# Patient Record
Sex: Female | Born: 1960 | Race: White | Hispanic: No | Marital: Married | Smoking: Former smoker
Health system: Southern US, Community
[De-identification: ages and names within clinical notes are randomized; demographics above are authoritative.]

## PROBLEM LIST (undated history)

## (undated) DIAGNOSIS — E559 Vitamin D deficiency, unspecified: Secondary | ICD-10-CM

## (undated) DIAGNOSIS — M1711 Unilateral primary osteoarthritis, right knee: Secondary | ICD-10-CM

## (undated) DIAGNOSIS — E063 Autoimmune thyroiditis: Secondary | ICD-10-CM

## (undated) DIAGNOSIS — L929 Granulomatous disorder of the skin and subcutaneous tissue, unspecified: Secondary | ICD-10-CM

## (undated) DIAGNOSIS — E039 Hypothyroidism, unspecified: Secondary | ICD-10-CM

## (undated) DIAGNOSIS — F32A Depression, unspecified: Secondary | ICD-10-CM

## (undated) DIAGNOSIS — E7800 Pure hypercholesterolemia, unspecified: Secondary | ICD-10-CM

## (undated) DIAGNOSIS — F329 Major depressive disorder, single episode, unspecified: Secondary | ICD-10-CM

## (undated) DIAGNOSIS — M1990 Unspecified osteoarthritis, unspecified site: Secondary | ICD-10-CM

## (undated) DIAGNOSIS — K219 Gastro-esophageal reflux disease without esophagitis: Secondary | ICD-10-CM

## (undated) DIAGNOSIS — J45909 Unspecified asthma, uncomplicated: Secondary | ICD-10-CM

## (undated) DIAGNOSIS — T7840XA Allergy, unspecified, initial encounter: Secondary | ICD-10-CM

## (undated) HISTORY — DX: Gastro-esophageal reflux disease without esophagitis: K21.9

## (undated) HISTORY — DX: Major depressive disorder, single episode, unspecified: F32.9

## (undated) HISTORY — DX: Vitamin D deficiency, unspecified: E55.9

## (undated) HISTORY — DX: Depression, unspecified: F32.A

## (undated) HISTORY — DX: Unspecified osteoarthritis, unspecified site: M19.90

## (undated) HISTORY — DX: Unspecified asthma, uncomplicated: J45.909

## (undated) HISTORY — DX: Hypothyroidism, unspecified: E03.9

## (undated) HISTORY — PX: COLONOSCOPY: SHX174

## (undated) HISTORY — PX: CARPOMETACARPAL (CMC) FUSION OF THUMB: SHX6290

## (undated) HISTORY — DX: Allergy, unspecified, initial encounter: T78.40XA

## (undated) HISTORY — PX: OTHER SURGICAL HISTORY: SHX169

## (undated) HISTORY — PX: WISDOM TOOTH EXTRACTION: SHX21

---

## 2000-10-16 ENCOUNTER — Other Ambulatory Visit: Admission: RE | Admit: 2000-10-16 | Discharge: 2000-10-16 | Payer: Self-pay | Admitting: *Deleted

## 2001-02-13 ENCOUNTER — Encounter: Admission: RE | Admit: 2001-02-13 | Discharge: 2001-02-13 | Payer: Self-pay | Admitting: *Deleted

## 2001-02-13 ENCOUNTER — Encounter: Payer: Self-pay | Admitting: *Deleted

## 2004-01-13 ENCOUNTER — Other Ambulatory Visit: Admission: RE | Admit: 2004-01-13 | Discharge: 2004-01-13 | Payer: Self-pay | Admitting: Gynecology

## 2006-12-27 ENCOUNTER — Other Ambulatory Visit: Admission: RE | Admit: 2006-12-27 | Discharge: 2006-12-27 | Payer: Self-pay | Admitting: Gynecology

## 2008-12-03 ENCOUNTER — Other Ambulatory Visit: Admission: RE | Admit: 2008-12-03 | Discharge: 2008-12-03 | Payer: Self-pay | Admitting: Gynecology

## 2008-12-03 ENCOUNTER — Encounter: Payer: Self-pay | Admitting: Women's Health

## 2008-12-03 ENCOUNTER — Ambulatory Visit: Payer: Self-pay | Admitting: Women's Health

## 2008-12-15 ENCOUNTER — Ambulatory Visit: Payer: Self-pay | Admitting: Gynecology

## 2009-12-16 ENCOUNTER — Other Ambulatory Visit: Admission: RE | Admit: 2009-12-16 | Discharge: 2009-12-16 | Payer: Self-pay | Admitting: Gynecology

## 2009-12-16 ENCOUNTER — Ambulatory Visit: Payer: Self-pay | Admitting: Women's Health

## 2010-06-16 ENCOUNTER — Ambulatory Visit (INDEPENDENT_AMBULATORY_CARE_PROVIDER_SITE_OTHER): Payer: 59 | Admitting: Women's Health

## 2010-06-16 DIAGNOSIS — N898 Other specified noninflammatory disorders of vagina: Secondary | ICD-10-CM

## 2010-06-16 DIAGNOSIS — B373 Candidiasis of vulva and vagina: Secondary | ICD-10-CM

## 2011-02-02 ENCOUNTER — Other Ambulatory Visit: Payer: Self-pay | Admitting: Women's Health

## 2011-02-20 ENCOUNTER — Telehealth: Payer: Self-pay | Admitting: Obstetrics and Gynecology

## 2011-02-20 MED ORDER — NITROFURANTOIN MONOHYD MACRO 100 MG PO CAPS: 100.0000 mg | ORAL_CAPSULE | Freq: Two times a day (BID) | ORAL | Status: AC

## 2011-02-20 NOTE — Telephone Encounter (Signed)
Pt called complaining of dysuria, frequency and urgency. She was treated with Macrobid bid with food for 7 days. She will come in for toc next week.

## 2011-05-22 ENCOUNTER — Encounter: Payer: Self-pay | Admitting: Women's Health

## 2011-10-12 ENCOUNTER — Encounter: Payer: Self-pay | Admitting: *Deleted

## 2011-10-12 ENCOUNTER — Ambulatory Visit (INDEPENDENT_AMBULATORY_CARE_PROVIDER_SITE_OTHER): Payer: 59 | Admitting: Women's Health

## 2011-10-12 ENCOUNTER — Encounter: Payer: Self-pay | Admitting: Women's Health

## 2011-10-12 DIAGNOSIS — L259 Unspecified contact dermatitis, unspecified cause: Secondary | ICD-10-CM

## 2011-10-12 DIAGNOSIS — R350 Frequency of micturition: Secondary | ICD-10-CM

## 2011-10-12 DIAGNOSIS — L309 Dermatitis, unspecified: Secondary | ICD-10-CM

## 2011-10-12 LAB — URINALYSIS W MICROSCOPIC + REFLEX CULTURE
Bilirubin Urine: NEGATIVE
Casts: NONE SEEN
Crystals: NONE SEEN
Glucose, UA: NEGATIVE mg/dL
Ketones, ur: NEGATIVE mg/dL
Nitrite: NEGATIVE
Protein, ur: NEGATIVE mg/dL
Specific Gravity, Urine: <1.005 — ABNORMAL LOW (ref 1.005–1.030)
Urobilinogen, UA: 0.2 mg/dL (ref 0.0–1.0)
pH: 5.5 (ref 5.0–8.0)

## 2011-10-12 MED ORDER — DOXYCYCLINE HYCLATE 100 MG PO TABS: ORAL_TABLET | ORAL | Status: DC

## 2011-10-12 MED ORDER — NITROFURANTOIN MONOHYD MACRO 100 MG PO CAPS: 100.0000 mg | ORAL_CAPSULE | Freq: Two times a day (BID) | ORAL | Status: AC

## 2011-10-12 MED ORDER — NITROFURANTOIN MACROCRYSTAL 50 MG PO CAPS: ORAL_CAPSULE | ORAL | Status: DC

## 2011-10-12 NOTE — Patient Instructions (Addendum)

## 2011-10-12 NOTE — Progress Notes (Signed)
Patient ID: Nancy Burch, female   DOB: 07/14/1960, 51 y.o.   MRN: [id number] Presents with the complaint of increased urinary frequency, urgency, pain at end of the stream of urination, symptoms started yesterday. Denies any vaginal discharge or fever. Has had some problems with increased UTIs in the past several years that she relates more to intercourse, which is infrequent, same partner.  Exam: UA large blood, small leukocytes, WBCs TNTC. No CVAT.  UTI  Plan: Macrobid 1 by mouth twice a day for 7 days, take with food, prescription and instructions given. Macrodantin 50 mg with intercourse to help prevent UTIs. Also requested refill on her doxycycline that she uses for eczema will followup with dermatologist. Prescription was given, reviewed will need to followup with dermatologist for additional refills. Instructed to call or return if no relief of symptoms. UTI prevention discussed.

## 2011-10-15 LAB — URINE CULTURE

## 2011-10-16 ENCOUNTER — Telehealth: Payer: Self-pay | Admitting: Women's Health

## 2011-10-16 NOTE — Telephone Encounter (Signed)
I closed result note in error before I was finished.  Patient called back and said her UTI sx have completely resolved. She is finishing up the Macrobid.   Notes Recorded by Keenan Bachelor on 10/16/2011 at 10:23 AM Left message to call me. ------  Notes Recorded by Harrington Challenger, NP on 10/16/2011 at 7:59 AM Please call patient and inform urine culture was positive for small amount of bacteria. Ask if better, if not let me know.

## 2011-10-19 ENCOUNTER — Encounter: Payer: Self-pay | Admitting: Women's Health

## 2012-04-17 ENCOUNTER — Encounter: Payer: Self-pay | Admitting: Women's Health

## 2012-04-17 ENCOUNTER — Ambulatory Visit (INDEPENDENT_AMBULATORY_CARE_PROVIDER_SITE_OTHER): Payer: 59 | Admitting: Women's Health

## 2012-04-17 DIAGNOSIS — F32A Depression, unspecified: Secondary | ICD-10-CM

## 2012-04-17 DIAGNOSIS — F329 Major depressive disorder, single episode, unspecified: Secondary | ICD-10-CM

## 2012-04-17 DIAGNOSIS — J329 Chronic sinusitis, unspecified: Secondary | ICD-10-CM

## 2012-04-17 MED ORDER — PAROXETINE HCL 20 MG PO TABS: 20.0000 mg | ORAL_TABLET | ORAL | Status: DC

## 2012-04-17 MED ORDER — AZITHROMYCIN 250 MG PO TABS: ORAL_TABLET | ORAL | Status: DC

## 2012-04-17 NOTE — Patient Instructions (Signed)

## 2012-04-17 NOTE — Progress Notes (Signed)
Patient ID: [name], female   DOB: [birth year], 52 y.o.   MRN: [id number] Presents with grief from sudden loss of 67 year old daughter who died 2 months ago from pulmonary embolism/seizure/questionable CVA. States cries most days and is having difficulty moving on. Helping to care for her 66-year-old son. .Also states has been battling a upper respiratory/sinus infection and is not getting better on Levaquin after 7 days. Reports cough productive of green mucus, nasal congestion with minimal relief from Robitussin. Denies a fever. States has had problems with depression in the past and has used Paxil with good relief of symptoms.  Exam: Tearful, pain/pressure sinus facial cavities, throat red, lungs clear throughout.  Sinus infection resistant to Levaquin Unresolved grief/depression  Plan: Prescription for a Z-Pak to take 2 tablets tomorrow, 1 daily for 5 days prescription given. Instructed to increase rest as able.instructed to call or return if no relief of symptoms in one week. Reviewed importance of a counselor to help deal with grief. [name] business card given and instructed to schedule appointment, also reviewed grief support groups in the community may be helpful. Condolences given.

## 2012-05-30 ENCOUNTER — Encounter: Payer: Self-pay | Admitting: Women's Health

## 2012-05-30 ENCOUNTER — Ambulatory Visit (INDEPENDENT_AMBULATORY_CARE_PROVIDER_SITE_OTHER): Payer: 59 | Admitting: Women's Health

## 2012-05-30 ENCOUNTER — Telehealth: Payer: Self-pay | Admitting: *Deleted

## 2012-05-30 VITALS — Ht 64.0 in | Wt 212.0 lb

## 2012-05-30 DIAGNOSIS — R490 Dysphonia: Secondary | ICD-10-CM

## 2012-05-30 DIAGNOSIS — E079 Disorder of thyroid, unspecified: Secondary | ICD-10-CM

## 2012-05-30 LAB — T4: T4, Total: 8.2 ug/dL (ref 5.0–12.5)

## 2012-05-30 LAB — T3 UPTAKE: T3 Uptake: 31.2 % (ref 22.5–37.0)

## 2012-05-30 LAB — TSH: TSH: 3.093 u[IU]/mL (ref 0.350–4.500)

## 2012-05-30 MED ORDER — ALBUTEROL SULFATE HFA 108 (90 BASE) MCG/ACT IN AERS: 2.0000 | INHALATION_SPRAY | Freq: Four times a day (QID) | RESPIRATORY_TRACT | Status: DC | PRN

## 2012-05-30 NOTE — Telephone Encounter (Signed)
Per nancy staff message:  Please schedule ENT appointment, anytime ok except 06/11/12. Unexplained hoarseness without illness, checking thyroid fnct. Rt side of throat fullness. No pain  appt scheduled on 06/03/12 with Dr.Newman at 12:30 pm. Notes faxed.

## 2012-05-30 NOTE — Progress Notes (Signed)
Patient ID: Nancy Burch, female   DOB: Jul 26, 1960, 52 y.o.   MRN: [id number] Presents with complaint of hoarseness without illness for greater than 3 weeks. Feels like right side of throat is swollen at times and difficult to get a deep breath. Denies a sore throat, nasal drainage, cough, or fever. Treated for a sinus infection 2 months ago with a Z-Pak and Levaquin.  Exam: Appears well, throat pink, no erythema or exudate noted, questionable thyroid enlargement on right side, minimal tenderness with exam,  Unexplained hoarseness Questionable thyroid enlargement  Plan: Will schedule appointment for ENT to evaluate. Aware inhaler to use as a rescue inhaler until office appointment if needed. Thyroid function panel. Overdue for annual exam, will schedule.

## 2012-05-31 LAB — THYROID ANTIBODIES
Thyroglobulin Ab: 198 U/mL — ABNORMAL HIGH (ref <40.0)
Thyroperoxidase Ab SerPl-aCnc: 478 IU/mL — ABNORMAL HIGH (ref <35.0)

## 2012-06-13 ENCOUNTER — Other Ambulatory Visit (HOSPITAL_COMMUNITY)
Admission: RE | Admit: 2012-06-13 | Discharge: 2012-06-13 | Disposition: A | Discharge status: Home / Self Care | Payer: 59 | Source: Ambulatory Visit | Attending: Obstetrics and Gynecology | Admitting: Obstetrics and Gynecology

## 2012-06-13 ENCOUNTER — Encounter: Payer: Self-pay | Admitting: Women's Health

## 2012-06-13 ENCOUNTER — Ambulatory Visit (INDEPENDENT_AMBULATORY_CARE_PROVIDER_SITE_OTHER): Payer: 59 | Admitting: Women's Health

## 2012-06-13 VITALS — BP 124/82 | Ht 63.0 in | Wt 210.0 lb

## 2012-06-13 DIAGNOSIS — N912 Amenorrhea, unspecified: Secondary | ICD-10-CM

## 2012-06-13 DIAGNOSIS — Z833 Family history of diabetes mellitus: Secondary | ICD-10-CM

## 2012-06-13 DIAGNOSIS — Z01419 Encounter for gynecological examination (general) (routine) without abnormal findings: Secondary | ICD-10-CM

## 2012-06-13 DIAGNOSIS — Z13220 Encounter for screening for lipoid disorders: Secondary | ICD-10-CM

## 2012-06-13 LAB — GLUCOSE, RANDOM: Glucose, Bld: 87 mg/dL (ref 70–99)

## 2012-06-13 LAB — CBC WITH DIFFERENTIAL/PLATELET
Eosinophils Absolute: 0.2 10*3/uL (ref 0.0–0.7)
Eosinophils Relative: 3 % (ref 0–5)
HCT: 39.7 % (ref 36.0–46.0)
Hemoglobin: 13.7 g/dL (ref 12.0–15.0)
Lymphocytes Relative: 46 % (ref 12–46)
Lymphs Abs: 3 10*3/uL (ref 0.7–4.0)
MCH: 29.2 pg (ref 26.0–34.0)
MCV: 84.6 fL (ref 78.0–100.0)
Monocytes Relative: 7 % (ref 3–12)
RBC: 4.69 MIL/uL (ref 3.87–5.11)
WBC: 6.6 10*3/uL (ref 4.0–10.5)

## 2012-06-13 LAB — FOLLICLE STIMULATING HORMONE: FSH: 97.7 m[IU]/mL

## 2012-06-13 LAB — LIPID PANEL
Cholesterol: 252 mg/dL — ABNORMAL HIGH (ref 0–200)
Total CHOL/HDL Ratio: 5 Ratio
VLDL: 54 mg/dL — ABNORMAL HIGH (ref 0–40)

## 2012-06-13 NOTE — Progress Notes (Signed)
Nancy Burch 1960/10/10 [id number]    History:    The patient presents for annual exam.  Amenorrheic since June 2013 with no other menopausal symptoms. Mother with breast cancer at 72,  Normal Pap and mammogram history. Has not had a colonoscopy. 53 year old daughter died suddenly several months ago, helping to care for 60-1/2-year-old grandson CJ and son-in-law lives with her and husband. Has been on Paxil 20 mg for one month which has helped, has spoken with a counselor and will continue as needed.  Past medical history, past surgical history, family history and social history were all reviewed and documented in the EPIC chart. New onset problem of hoarseness, slight tenderness, and has had some problems with loosing her voice was seen by Dr. Ezzard Standing who did not find a problem. Has followup scheduled with Dr. Talmage Nap 07/08/12, positive thyroid antibodies were noted on thyroid panel. TSH, T3, T4 all normal.Father melanoma, sister with diabetes and colon cancer.   ROS:  A  ROS was performed and pertinent positives and negatives are included in the history.  Exam:  Filed Vitals:   06/13/12 1521  BP: 124/82    General appearance:  Normal Head/Neck:  Normal, without cervical or supraclavicular adenopathy. Thyroid:  Symmetrical, normal in size, without palpable masses or nodularity. Respiratory  Effort:  Normal  Auscultation:  Clear without wheezing or rhonchi Cardiovascular  Auscultation:  Regular rate, without rubs, murmurs or gallops  Edema/varicosities:  Not grossly evident Abdominal  Soft,nontender, without masses, guarding or rebound.  Liver/spleen:  No organomegaly noted  Hernia:  None appreciated  Skin  Inspection:  Grossly normal  Palpation:  Grossly normal Neurologic/psychiatric  Orientation:  Normal with appropriate conversation.  Mood/affect:  Normal  Genitourinary    Breasts: Examined lying and sitting.     Right: Without masses, retractions, discharge or axillary  adenopathy.     Left: Without masses, retractions, discharge or axillary adenopathy.   Inguinal/mons:  Normal without inguinal adenopathy  External genitalia:  Normal  BUS/Urethra/Skene's glands:  Normal  Bladder:  Normal  Vagina:  Normal  Cervix:  Normal  Uterus:   normal in size, shape and contour.  Midline and mobile  Adnexa/parametria:     Rt: Without masses or tenderness.   Lt: Without masses or tenderness.  Anus and perineum: Normal  Digital rectal exam: Normal sphincter tone without palpated masses or tenderness  Assessment/Plan:  52 y.o.M. WF  for annual exam.   Persistent hoarseness causing her to lose her voice for the past 6 weeks, normal exam with Dr. Narda Bonds ENT 06/03/12. Positive thyroid antibodies with normal TSH, T3, T4, followup Dr. Talmage Nap Situational depression/sudden death of 27 year old daughter/Paxil started 2/ 2014 with good results Obesity   Plan: Keep scheduled followup with Dr. Talmage Nap 07/08/12. SBE's, annual mammogram, overdue instructed to schedule. Colonoscopy, instructed to schedule. Paxil 20 mg prescription, proper use, reviewed importance of continuing medication at least 6 months, return to counseling as needed. Increase regular exercise and decrease calories for weight loss, vitamin D 2000 daily encouraged. Annual skin check with dermatologist/father history of melanoma.CBC, FSH, glucose, lipid panel, UA, Pap. Pap normal 2011, and new guidelines reviewed.    Harrington Challenger [facility], 4:58 PM 06/13/2012

## 2012-06-13 NOTE — Patient Instructions (Addendum)
Dr Luevenia Maxin GI Health Recommendations for Postmenopausal Women Based on the Results of the Women's Health Initiative [facility]) and Other Studies The WHI is a major 15-year research program to address the most common causes of death, disability and poor quality of life in postmenopausal women. Some of these causes are heart disease, cancer, bone loss (osteoporosis) and others. Taking into account all of the findings from [facility] and other studies, here are bottom-line health recommendations for women: CARDIOVASCULAR DISEASE Heart Disease: A heart attack is a medical emergency. Know the signs and symptoms of a heart attack. Hormone therapy should not be used to prevent heart disease. In women with heart disease, hormone therapy should not be used to prevent further disease. Hormone therapy increases the risk of blood clots. Below are things women can do to reduce their risk for heart disease.   Do not smoke. If you smoke, quit. Women who smoke are 2 to 6 times more likely to suffer a heart attack than non-smoking women.  Aim for a healthy weight. Being overweight causes many preventable deaths. Eat a healthy and balanced diet and drink an adequate amount of liquids.  Get moving. Make a commitment to be more physically active. Aim for 30 minutes of activity on most, if not all days of the week.  Eat for heart health. Choose a diet that is low in saturated fat, trans fat, and cholesterol. Include whole grains, vegetables, and fruits. Read the labels on the food container before buying it.  Know your numbers. Ask your caregiver to check your blood pressure, cholesterol (total, HDL, LDL, triglycerides) and blood glucose. Work with your caregiver to improve any numbers that are not normal.  High blood pressure. Limit or stop your table salt intake (try salt substitute and food seasonings), avoid salty foods and drinks. Read the labels on the food container before buying it. Avoid becoming overweight by eating  well and exercising. STROKE  Stroke is a medical emergency. Stroke can be the result of a blood clot in the blood vessel in the brain or by a brain hemorrhage (bleeding). Know the signs and symptoms of a stroke. To lower the risk of developing a stroke:  Avoid fatty foods.  Quit smoking.  Control your diabetes, blood pressure, and irregular heart rate. THROMBOPHLIBITIS (BLOOD CLOT) OF THE LEG  Hormone treatment is a big cause of developing blood clots in the leg. Becoming overweight and leading a stationary lifestyle also may contribute to developing blood clots. Controlling your diet and exercising will help lower the risk of developing blood clots. CANCER SCREENING  Breast Cancer: Women should take steps to reduce their risk of breast cancer. This includes having regular mammograms, monthly self breast exams and regular breast exams by your caregiver. Have a mammogram every one to two years if you are 54 to 53 years old. Have a mammogram annually if you are 55 years old or older depending on your risk factors. Women who are high risk for breast cancer may need more frequent mammograms. There are tests available (testing the genes in your body) if you have family history of breast cancer called BRCA 1 and 2. These tests can help determine the risks of developing breast cancer.  Intestinal or Stomach Cancer: Women should talk to their caregiver about when to start screening, what tests and how often they should be done, and the benefits and risks of doing these tests. Tests to consider are a rectal exam, fecal occult blood, sigmoidoscopy, colononoscoby, barium enema and  upper GI series of the stomach. Depending on the age, you may want to get a medical and family history of colon cancer. Women who are high risk may need to be screened at an earlier age and more often.  Cervical Cancer: A Pap test of the cervix should be done every year and every 3 years when there has been three straight years of a  normal Pap test. Women with an abnormal Pap test should be screened more often or have a cervical biopsy depending on your caregiver's recommendation.  Uterine Cancer: If you have vaginal bleeding after you are in the menopause, it should be evaluated by your caregiver.  Ovarian cancer: There are no reliable tests available to screen for ovarian cancer at this time except for yearly pelvic exams.  Lung Cancer: Yearly chest X-rays can detect lung cancer and should be done on high risk women, such as cigarette smokers and women with chronic lung disease (emphysemia).  Skin Cancer: A complete body skin exam should be done at your yearly examination. Avoid overexposure to the sun and ultraviolet light lamps. Use a strong sun block cream when in the sun. All of these things are important in lowering the risk of skin cancer. MENOPAUSE Menopause Symptoms: Hormone therapy products are effective for treating symptoms associated with menopause:  Moderate to severe hot flashes.  Night sweats.  Mood swings.  Headaches.  Tiredness.  Loss of sex drive.  Insomnia.  Other symptoms. However, hormone therapy products carry serious risks, especially in older women. Women who use or are thinking about using estrogen or estrogen with progestin treatments should discuss that with their caregiver. Your caregiver will know if the benefits outweigh the risks. The Food and Drug Administration (FDA) has concluded that hormone therapy should be used only at the lowest doses and for the shortest amount of time to reach treatment goals. It is not known at what doses there may be less risk of serious side effects. There are other treatments such as herbal medication (not controlled or regulated by the FDA), group therapy, counseling and acupuncture that may be helpful. OSTEOPOROSIS Protecting Against Bone Loss and Preventing Fracture: If hormone therapy is used for prevention of bone loss (osteoporosis), the risks for  bone loss must outweigh the risk of the therapy. Women considering taking hormone therapy for bone loss should ask their health care providers about other medications (fosamax and boniva) that are considered safe and effective for preventing bone loss and bone fractures. To guard against bone loss or fractures, it is recommended that women should take at least 1000-1500 mg of calcium and 400-800 IU of vitamin D daily in divided doses. Smoking and excessive alcohol intake increases the risk of osteoporosis. Eat foods rich in calcium and vitamin D and do weight bearing exercises several times a week as your caregiver suggests. DIABETES Diabetes Melitus: Women with Type I or Type 2 diabetes should keep their diabetes in control with diet, exercise and medication. Avoid too many sweets, starchy and fatty foods. Being overweight can affect your diabetes. COGNITION AND MEMORY Cognition and Memory: Menopausal hormone therapy is not recommended for the prevention of cognitive disorders such as Alzheimer's disease or memory loss. WHI found that women treated with hormone therapy have a greater risk of developing dementia.  DEPRESSION  Depression may occur at any age, but is common in elderly women. The reasons may be because of physical, medical, social (loneliness), financial and/or economic problems and needs. Becoming involved with church, volunteer  or social groups, seeking treatment for any physical or medical problems is recommended. Also, look into getting professional advice for any economic or financial problems. ACCIDENTS  Accidents are common and can be serious in the elderly woman. Prepare your house to prevent accidents. Eliminate throw rugs, use hip protectors, place hand bars in the bath, shower and toilet areas. Avoid wearing high heel shoes and walking on wet, snowy and icy areas. Stop driving if you have vision, hearing problems or are unsteady with you movements and reflexes. RHEUMATOID  ARTHRITIS Rheumatoid arthritis causes pain, swelling and stiffness of your bone joints. It can limit many of your activities. Over-the-counter medications may help, but prescription medications may be necessary. Talk with your caregiver about this. Exercise (walking, water aerobics), good posture, using splints on painful joints, warm baths or applying warm compresses to stiff joints and cold compresses to painful joints may be helpful. Smoking and excessive drinking may worsen the symptoms of arthritis. Seek help from a physical therapist if the arthritis is becoming a problem with your daily activities. IMMUNIZATIONS  Several immunizations are important to have during your senior years, including:   Tetanus and a diptheria shot booster every 10 years.  Influenza every year before the flu season begins.  Pneumonia vaccine.  Shingles vaccine.  Others as indicated (example: H1N1 vaccine). Document Released: 04/07/2005 Document Revised: 05/08/2011 Document Reviewed: 12/02/2007 [facility] Patient Information 2013 [place], [place].

## 2012-06-14 LAB — URINALYSIS W MICROSCOPIC + REFLEX CULTURE
Bacteria, UA: NONE SEEN
Casts: NONE SEEN
Crystals: NONE SEEN
Glucose, UA: NEGATIVE mg/dL
Hgb urine dipstick: NEGATIVE
Ketones, ur: NEGATIVE mg/dL
Leukocytes, UA: NEGATIVE
Nitrite: NEGATIVE
Specific Gravity, Urine: 1.017 (ref 1.005–1.030)
pH: 6 (ref 5.0–8.0)

## 2012-08-05 ENCOUNTER — Other Ambulatory Visit: Payer: Self-pay

## 2012-08-05 DIAGNOSIS — F329 Major depressive disorder, single episode, unspecified: Secondary | ICD-10-CM

## 2012-08-05 MED ORDER — PAROXETINE HCL 20 MG PO TABS: 20.0000 mg | ORAL_TABLET | ORAL | Status: DC

## 2012-08-05 NOTE — Telephone Encounter (Signed)
Telephone call, states is doing better, would like to continue the Paxil 20.

## 2012-08-05 NOTE — Telephone Encounter (Signed)
Okay to call in paxil, I left her message to call, her 16 something-year-old daughter died last year/seizure or a stroke. Ask her how she is doing, has she had counseling, does she need to come in to discuss meds.

## 2012-08-06 ENCOUNTER — Other Ambulatory Visit: Payer: Self-pay | Admitting: Women's Health

## 2012-08-06 MED ORDER — PAROXETINE HCL 20 MG PO TABS: 20.0000 mg | ORAL_TABLET | ORAL | Status: DC

## 2012-09-09 ENCOUNTER — Encounter: Payer: Self-pay | Admitting: Women's Health

## 2012-12-02 ENCOUNTER — Encounter: Payer: Self-pay | Admitting: Gastroenterology

## 2013-01-02 ENCOUNTER — Other Ambulatory Visit: Payer: Self-pay

## 2013-01-08 ENCOUNTER — Other Ambulatory Visit: Payer: Self-pay | Admitting: Endocrinology

## 2013-01-08 ENCOUNTER — Ambulatory Visit (AMBULATORY_SURGERY_CENTER): Payer: Self-pay | Admitting: *Deleted

## 2013-01-08 VITALS — Ht 64.0 in | Wt 230.8 lb

## 2013-01-08 DIAGNOSIS — Z1211 Encounter for screening for malignant neoplasm of colon: Secondary | ICD-10-CM

## 2013-01-08 DIAGNOSIS — E049 Nontoxic goiter, unspecified: Secondary | ICD-10-CM

## 2013-01-08 MED ORDER — NA SULFATE-K SULFATE-MG SULF 17.5-3.13-1.6 GM/177ML PO SOLN: 1.0000 | Freq: Once | ORAL | Status: DC

## 2013-01-08 NOTE — Progress Notes (Signed)
No allergies to eggs or soy. No problems with anesthesia.  

## 2013-01-10 ENCOUNTER — Encounter: Payer: Self-pay | Admitting: Gastroenterology

## 2013-01-10 ENCOUNTER — Ambulatory Visit
Admission: RE | Admit: 2013-01-10 | Discharge: 2013-01-10 | Disposition: A | Discharge status: Home / Self Care | Payer: 59 | Source: Ambulatory Visit | Attending: Endocrinology | Admitting: Endocrinology

## 2013-01-10 ENCOUNTER — Other Ambulatory Visit: Payer: 59

## 2013-01-10 DIAGNOSIS — E049 Nontoxic goiter, unspecified: Secondary | ICD-10-CM

## 2013-01-21 ENCOUNTER — Other Ambulatory Visit: Payer: Self-pay | Admitting: Endocrinology

## 2013-01-21 DIAGNOSIS — E041 Nontoxic single thyroid nodule: Secondary | ICD-10-CM

## 2013-01-28 ENCOUNTER — Other Ambulatory Visit (HOSPITAL_COMMUNITY)
Admission: RE | Admit: 2013-01-28 | Discharge: 2013-01-28 | Disposition: A | Discharge status: Home / Self Care | Payer: 59 | Source: Ambulatory Visit | Attending: Diagnostic Radiology | Admitting: Diagnostic Radiology

## 2013-01-28 ENCOUNTER — Ambulatory Visit
Admission: RE | Admit: 2013-01-28 | Discharge: 2013-01-28 | Disposition: A | Discharge status: Home / Self Care | Payer: 59 | Source: Ambulatory Visit | Attending: Endocrinology | Admitting: Endocrinology

## 2013-01-28 DIAGNOSIS — E041 Nontoxic single thyroid nodule: Secondary | ICD-10-CM | POA: Insufficient documentation

## 2013-01-28 HISTORY — PX: BIOPSY THYROID: PRO38

## 2013-01-29 ENCOUNTER — Encounter: Payer: Self-pay | Admitting: Gastroenterology

## 2013-01-29 ENCOUNTER — Ambulatory Visit (AMBULATORY_SURGERY_CENTER): Payer: 59 | Admitting: Gastroenterology

## 2013-01-29 VITALS — BP 126/69 | HR 64 | Temp 96.8°F | Resp 14 | Ht 64.0 in | Wt 230.0 lb

## 2013-01-29 DIAGNOSIS — K648 Other hemorrhoids: Secondary | ICD-10-CM

## 2013-01-29 DIAGNOSIS — Z1211 Encounter for screening for malignant neoplasm of colon: Secondary | ICD-10-CM

## 2013-01-29 DIAGNOSIS — Z800 Family history of malignant neoplasm of digestive organs: Secondary | ICD-10-CM

## 2013-01-29 MED ORDER — SODIUM CHLORIDE 0.9 % IV SOLN: 500.0000 mL | INTRAVENOUS | Status: DC

## 2013-01-29 NOTE — Progress Notes (Signed)
Procedure ends, to recovery, report given and VSS. 

## 2013-01-29 NOTE — Op Note (Signed)
[facility] [address] [place], [address]   COLONOSCOPY PROCEDURE REPORT  PATIENT: Nancy Burch, Nancy Burch  MR#: [id number] BIRTHDATE: [birth year] , 52  yrs. old GENDER: Female ENDOSCOPIST: Louis Meckel, MD REFERRED AO:[id number] Fontaine, M.D. PROCEDURE DATE:  01/29/2013 PROCEDURE:   Colonoscopy, diagnostic First Screening Colonoscopy - Avg.  risk and is 50 yrs.  old or older Yes.  Prior Negative Screening - Now for repeat screening. N/A  History of Adenoma - Now for follow-up colonoscopy & has been > or = to 3 yrs.  N/A  Polyps Removed Today? No.  Recommend repeat exam, <10 yrs? Yes.  High risk (family or personal hx). ASA CLASS:   Class II INDICATIONS:Patient's immediate family history of colon cancer, sister with colon cancer age 17 MEDICATIONS: MAC sedation, administered by CRNA and propofol (Diprivan) 350mg  IV  DESCRIPTION OF PROCEDURE:   After the risks benefits and alternatives of the procedure were thoroughly explained, informed consent was obtained.  A digital rectal exam revealed no abnormalities of the rectum.   The LB [id number]  endoscope was introduced through the anus and advanced to the cecum, which was identified by both the appendix and ileocecal valve. No adverse events experienced.   The quality of the prep was excellent using Suprep  The instrument was then slowly withdrawn as the colon was fully examined.      COLON FINDINGS: Internal hemorrhoids were found.   The colon was otherwise normal.  There was no diverticulosis, inflammation, polyps or cancers unless previously stated.  Retroflexed views revealed no abnormalities. The time to cecum=3 minutes 50 seconds. Withdrawal time=7 minutes 03 seconds.  The scope was withdrawn and the procedure completed. COMPLICATIONS: There were no complications.  ENDOSCOPIC IMPRESSION: 1.   Internal hemorrhoids 2.   The colon was otherwise normal  RECOMMENDATIONS: Given your significant  family history of colon cancer, you should have a repeat colonoscopy in 5 years   eSigned:  Louis Meckel, MD 01/29/2013 11:50 AM   cc:   PATIENT NAME:  Nancy Burch, Nancy Burch MR#: [id number]

## 2013-01-29 NOTE — Patient Instructions (Signed)
Discharge instructions given with verbal understanding. Handouts on hemorrhoids and a high fiber diet. Resume previous medications. YOU HAD AN ENDOSCOPIC PROCEDURE TODAY AT THE [facility]: Refer to the procedure report that was given to you for any specific questions about what was found during the examination.  If the procedure report does not answer your questions, please call your gastroenterologist to clarify.  If you requested that your care partner not be given the details of your procedure findings, then the procedure report has been included in a sealed envelope for you to review at your convenience later.  YOU SHOULD EXPECT: Some feelings of bloating in the abdomen. Passage of more gas than usual.  Walking can help get rid of the air that was put into your GI tract during the procedure and reduce the bloating. If you had a lower endoscopy (such as a colonoscopy or flexible sigmoidoscopy) you may notice spotting of blood in your stool or on the toilet paper. If you underwent a bowel prep for your procedure, then you may not have a normal bowel movement for a few days.  DIET: Your first meal following the procedure should be a light meal and then it is ok to progress to your normal diet.  A half-sandwich or bowl of soup is an example of a good first meal.  Heavy or fried foods are harder to digest and may make you feel nauseous or bloated.  Likewise meals heavy in dairy and vegetables can cause extra gas to form and this can also increase the bloating.  Drink plenty of fluids but you should avoid alcoholic beverages for 24 hours.  ACTIVITY: Your care partner should take you home directly after the procedure.  You should plan to take it easy, moving slowly for the rest of the day.  You can resume normal activity the day after the procedure however you should NOT DRIVE or use heavy machinery for 24 hours (because of the sedation medicines used during the test).    SYMPTOMS TO REPORT  IMMEDIATELY: A gastroenterologist can be reached at any hour.  During normal business hours, 8:30 AM to 5:00 PM Monday through Friday, call [phone].  After hours and on weekends, please call the GI answering service at [phone] who will take a message and have the physician on call contact you.   Following lower endoscopy (colonoscopy or flexible sigmoidoscopy):  Excessive amounts of blood in the stool  Significant tenderness or worsening of abdominal pains  Swelling of the abdomen that is new, acute  Fever of 100F or higher  OLLOW UP: If any biopsies were taken you will be contacted by phone or by letter within the next 1-3 weeks.  Call your gastroenterologist if you have not heard about the biopsies in 3 weeks.  Our staff will call the home number listed on your records the next business day following your procedure to check on you and address any questions or concerns that you may have at that time regarding the information given to you following your procedure. This is a courtesy call and so if there is no answer at the home number and we have not heard from you through the emergency physician on call, we will assume that you have returned to your regular daily activities without incident.  SIGNATURES/CONFIDENTIALITY: You and/or your care partner have signed paperwork which will be entered into your electronic medical record.  These signatures attest to the fact that that the information above on your  After Visit Summary has been reviewed and is understood.  Full responsibility of the confidentiality of this discharge information lies with you and/or your care-partner.

## 2013-01-29 NOTE — Progress Notes (Signed)
Patient did not experience any of the following events: a burn prior to discharge; a fall within the facility; wrong site/side/patient/procedure/implant event; or a hospital transfer or hospital admission upon discharge from the facility. (G8907) Patient did not have preoperative order for IV antibiotic SSI prophylaxis. ([id number])  

## 2013-01-30 ENCOUNTER — Telehealth: Payer: Self-pay | Admitting: *Deleted

## 2013-01-30 NOTE — Telephone Encounter (Signed)
Number identifier, left message, follow-up  

## 2013-06-25 ENCOUNTER — Other Ambulatory Visit: Payer: Self-pay | Admitting: Otolaryngology

## 2013-06-25 DIAGNOSIS — D497 Neoplasm of unspecified behavior of endocrine glands and other parts of nervous system: Secondary | ICD-10-CM

## 2013-07-10 ENCOUNTER — Ambulatory Visit
Admission: RE | Admit: 2013-07-10 | Discharge: 2013-07-10 | Disposition: A | Discharge status: Home / Self Care | Payer: 59 | Source: Ambulatory Visit | Attending: Otolaryngology | Admitting: Otolaryngology

## 2013-07-10 DIAGNOSIS — D497 Neoplasm of unspecified behavior of endocrine glands and other parts of nervous system: Secondary | ICD-10-CM

## 2013-07-11 ENCOUNTER — Other Ambulatory Visit: Payer: 59

## 2013-12-29 ENCOUNTER — Encounter: Payer: Self-pay | Admitting: Gastroenterology

## 2014-06-08 ENCOUNTER — Other Ambulatory Visit: Payer: Self-pay | Admitting: Otolaryngology

## 2014-06-08 DIAGNOSIS — D497 Neoplasm of unspecified behavior of endocrine glands and other parts of nervous system: Secondary | ICD-10-CM

## 2014-06-12 ENCOUNTER — Ambulatory Visit
Admission: RE | Admit: 2014-06-12 | Discharge: 2014-06-12 | Disposition: A | Discharge status: Home / Self Care | Payer: 59 | Source: Ambulatory Visit | Attending: Otolaryngology | Admitting: Otolaryngology

## 2014-06-12 DIAGNOSIS — D497 Neoplasm of unspecified behavior of endocrine glands and other parts of nervous system: Secondary | ICD-10-CM

## 2015-06-16 ENCOUNTER — Encounter (HOSPITAL_COMMUNITY): Payer: Self-pay | Admitting: Physician Assistant

## 2015-06-16 DIAGNOSIS — E063 Autoimmune thyroiditis: Secondary | ICD-10-CM | POA: Diagnosis present

## 2015-06-16 DIAGNOSIS — M1711 Unilateral primary osteoarthritis, right knee: Secondary | ICD-10-CM | POA: Diagnosis present

## 2015-06-16 DIAGNOSIS — E7800 Pure hypercholesterolemia, unspecified: Secondary | ICD-10-CM | POA: Diagnosis present

## 2015-06-16 DIAGNOSIS — L929 Granulomatous disorder of the skin and subcutaneous tissue, unspecified: Secondary | ICD-10-CM

## 2015-06-16 DIAGNOSIS — K219 Gastro-esophageal reflux disease without esophagitis: Secondary | ICD-10-CM | POA: Diagnosis present

## 2015-06-16 DIAGNOSIS — J45909 Unspecified asthma, uncomplicated: Secondary | ICD-10-CM | POA: Diagnosis present

## 2015-06-16 HISTORY — DX: Pure hypercholesterolemia, unspecified: E78.00

## 2015-06-16 HISTORY — DX: Granulomatous disorder of the skin and subcutaneous tissue, unspecified: L92.9

## 2015-06-16 HISTORY — DX: Unilateral primary osteoarthritis, right knee: M17.11

## 2015-06-16 HISTORY — DX: Autoimmune thyroiditis: E06.3

## 2015-06-16 NOTE — Pre-Procedure Instructions (Signed)
Nancy Burch  06/16/2015      [id number] - [place], [place] - [address] [place] [address] Phone: [phone] Fax: [phone]  [id number] - [place], [place] - [address] [place] [address] Phone: [phone] Fax: [phone]    Your procedure is scheduled on Monday, Jun 28, 2015 at 10:00 AM.  Report to [facility] Entrance "A" Admitting Office at 8:00 AM.  Call this number if you have problems the morning of surgery: [phone]  Any questions prior to day of surgery, please call [phone] between 8 & 4 PM.   Remember:  Do not eat food or drink liquids after midnight Sunday, 06/27/15.  Take these medicines the morning of surgery with A SIP OF WATER: Duloxetine (Cymbalta), Famotidine (Pepcid), Levothyroxine (Synthroid), Loratadine (Claritin), Advair inhaler, Albuterol inhaler - if needed (bring with you day of surgery).  Stop Multivitamins and NSAIDS (Meloxicam, Ibuprofen, Aleve, etc.) 7 days prior to surgery. Do not use Aspirin products 7 days prior to surgery.   Do not wear jewelry, make-up or nail polish.  Do not wear lotions, powders, or perfumes.  You may wear deodorant.  Do not shave 48 hours prior to surgery.    Do not bring valuables to the hospital.  [facility] is not responsible for any belongings or valuables.  Contacts, dentures or bridgework may not be worn into surgery.  Leave your suitcase in the car.  After surgery it may be brought to your room.  For patients admitted to the hospital, discharge time will be determined by your treatment team.  Special instructions:  [facility] - Preparing for Surgery  Before surgery, you can play an important role.  Because skin is not sterile, your skin needs to be as free of germs as possible.  You can reduce the number of germs on you skin by washing with CHG (chlorahexidine gluconate) soap before surgery.  CHG is an antiseptic cleaner  which kills germs and bonds with the skin to continue killing germs even after washing.  Please DO NOT use if you have an allergy to CHG or antibacterial soaps.  If your skin becomes reddened/irritated stop using the CHG and inform your nurse when you arrive at Short Stay.  Do not shave (including legs and underarms) for at least 48 hours prior to the first CHG shower.  You may shave your face.  Please follow these instructions carefully:   1.  Shower with CHG Soap the night before surgery and the                                morning of Surgery.  2.  If you choose to wash your hair, wash your hair first as usual with your       normal shampoo.  3.  After you shampoo, rinse your hair and body thoroughly to remove the                      Shampoo.  4.  Use CHG as you would any other liquid soap.  You can apply chg directly       to the skin and wash gently with scrungie or a clean washcloth.  5.  Apply the CHG Soap to your body ONLY FROM THE NECK DOWN.        Do not use on open wounds or  open sores.  Avoid contact with your eyes, ears, mouth and genitals (private parts).  Wash genitals (private parts) with your normal soap.  6.  Wash thoroughly, paying special attention to the area where your surgery        will be performed.  7.  Thoroughly rinse your body with warm water from the neck down.  8.  DO NOT shower/wash with your normal soap after using and rinsing off       the CHG Soap.  9.  Pat yourself dry with a clean towel.            10.  Wear clean pajamas.            11.  Place clean sheets on your bed the night of your first shower and do not        sleep with pets.  Day of Surgery  Do not apply any lotions the morning of surgery.  Please wear clean clothes to the hospital.   Please read over the following fact sheets that you were given. Pain Booklet, Coughing and Deep Breathing, MRSA Information and Surgical Site Infection Prevention

## 2015-06-16 NOTE — H&P (Signed)
TOTAL KNEE ADMISSION H&P  Patient is being admitted for right total knee arthroplasty.  Subjective:  Chief Complaint:right knee pain.  HPI: Nancy Burch, 55 y.o. female, has a history of pain and functional disability in the right knee due to arthritis and has failed non-surgical conservative treatments for greater than 12 weeks to includeNSAID's and/or analgesics, corticosteriod injections, viscosupplementation injections, flexibility and strengthening excercises, supervised PT with diminished ADL's post treatment, use of assistive devices, weight reduction as appropriate and activity modification.  Onset of symptoms was gradual, starting 10 years ago with gradually worsening course since that time. The patient noted no past surgery on the right knee(s).  Patient currently rates pain in the right knee(s) at 9 out of 10 with activity. Patient has night pain, worsening of pain with activity and weight bearing, pain that interferes with activities of daily living, crepitus and joint swelling.  Patient has evidence of subchondral sclerosis, periarticular osteophytes and joint space narrowing by imaging studies. There is no active infection.  Patient Active Problem List   Diagnosis Date Noted  . Primary localized osteoarthritis of right knee 06/16/2015  . Pure hypercholesterolemia 06/16/2015  . Hashimoto's thyroiditis 06/16/2015  . 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 Asthma   . GERD (gastroesophageal reflux disease)   . Depression 04/17/2012   Past Medical History  Diagnosis Date  . Asthma   . Hypothyroidism   . Depression   . Arthritis   . GERD (gastroesophageal reflux disease)   . Primary localized osteoarthritis of right knee 06/16/2015  . Pure hypercholesterolemia 06/16/2015  . Hashimoto's thyroiditis 06/16/2015  . 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 Cesarean section  1985, 1989  . Biopsy thyroid  01/28/2013    No current facility-administered medications for this encounter.  Current outpatient prescriptions:  .  albuterol (PROVENTIL HFA;VENTOLIN HFA) 108 (90 BASE) MCG/ACT inhaler, Inhale 2 puffs into the lungs every 6 (six) hours as needed for wheezing., Disp: 1 Inhaler, Rfl: 2 .  Cholecalciferol (VITAMIN D3) 5000 units TABS, Take 1 tablet by mouth daily., Disp: , Rfl:  .  doxycycline (DORYX) 100 MG EC tablet, Take 100 mg by mouth daily. with food, Disp: , Rfl: 1 .  DULoxetine (CYMBALTA) 60 MG capsule, Take 1 capsule by mouth daily., Disp: , Rfl: 1 .  famotidine (PEPCID) 10 MG tablet, Take 10 mg by mouth 2 (two) times daily., Disp: , Rfl:  .  fenofibrate 160 MG tablet, Take 1 tablet by mouth daily., Disp: , Rfl: 3 .  Fluticasone-Salmeterol (ADVAIR DISKUS) 100-50 MCG/DOSE AEPB, Inhale 1 puff into the lungs 2 (two) times daily as needed., Disp: , Rfl:  .  levothyroxine (SYNTHROID, LEVOTHROID) 75 MCG tablet, Take 75 mcg by mouth daily before breakfast., Disp: , Rfl:  .  loratadine (CLARITIN) 10 MG tablet, Take 10 mg by mouth daily., Disp: , Rfl:  .  meloxicam (MOBIC) 15 MG tablet, Take 1 tablet by mouth daily., Disp: , Rfl: 1 .  Multiple Vitamins-Minerals (MULTIVITAMIN WITH MINERALS) tablet, Take 1 tablet by mouth daily., Disp: , Rfl:  .  spironolactone (ALDACTONE) 50 MG tablet, Take 50 mg by mouth daily., Disp: , Rfl: 0 .  tacrolimus (PROTOPIC) 0.1 % ointment, Apply 1 application topically at bedtime., Disp: , Rfl: 1    Allergies  Allergen Reactions  . Milk-Related Compounds     Stomach cramps,  diarrhea  . Penicillins Hives    Social History  Substance Use Topics  . Smoking status: Former Smoker    Quit date: 06/27/2005  . Smokeless tobacco: Never Used  . Alcohol Use: 1.2 oz/week    2 Glasses of wine per week    Family History  Problem Relation Age of Onset  . Breast cancer Mother   . Melanoma Father   . Diabetes  Sister   . Colon cancer Sister 87  . Stomach cancer Neg Hx      Review of Systems  Constitutional: Negative.   HENT: Negative.   Respiratory: Negative.   Cardiovascular: Negative.   Gastrointestinal: Negative.   Genitourinary: Negative.   Musculoskeletal: Positive for back pain and joint pain.       Right knee   Skin: Negative.   Neurological: Negative.   Endo/Heme/Allergies: Negative.   Psychiatric/Behavioral: Negative.     Objective:  Physical Exam  Constitutional: She is oriented to person, place, and time. She appears well-developed and well-nourished.  HENT:  Head: Normocephalic and atraumatic.  Mouth/Throat: Oropharynx is clear and moist.  Eyes: Conjunctivae are normal. Pupils are equal, round, and reactive to light.  Neck: Neck supple.  Cardiovascular: Normal rate, regular rhythm and normal heart sounds.   Respiratory: Effort normal.  GI: Soft. Bowel sounds are normal.  Genitourinary:  Not pertinent to current symptomatology therefore not examined.  Musculoskeletal:  Examination of her right knee reveals pain medially and laterally.  1+ crepitation.  1+ synovitis.  Range of motion 0-120 degrees.  Knee is stable with normal patella tracking.  Examination of her left knee reveals full range of motion without pain, swelling, weakness or instability.  Vascular exam: Pulses are 2+ and symmetric.  Neurologic exam: Distal motor and sensory examination is within normal limits.   Neurological: She is alert and oriented to person, place, and time.  Skin: Skin is warm and dry.  Psychiatric: She has a normal mood and affect. Her behavior is normal.    Vital signs in last 24 hours:    Labs:   Estimated body mass index is 39.46 kg/(m^2) as calculated from the following:   Height as of 01/29/13: 5\' 4"  (1.626 m).   Weight as of 01/29/13: 104.327 kg (230 lb).   Imaging Review Plain radiographs demonstrate severe degenerative joint disease of the right knee(s). The overall  alignment issignificant valgus. The bone quality appears to be good for age and reported activity level.  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 The treatment options including medical management, injection therapy arthroscopy and arthroplasty were discussed at length. The risks and benefits of total knee arthroplasty were presented and reviewed. The risks due to aseptic loosening, infection, stiffness, patella tracking problems, thromboembolic complications and other imponderables were discussed. The patient acknowledged the explanation, agreed to proceed with the plan and consent was signed. Patient is being admitted for inpatient treatment for surgery, pain control, PT, OT, prophylactic antibiotics, VTE prophylaxis, progressive ambulation and ADL's and discharge planning. The patient is planning to  be discharged home with home health services

## 2015-06-17 ENCOUNTER — Encounter (HOSPITAL_COMMUNITY)
Admission: RE | Admit: 2015-06-17 | Discharge: 2015-06-17 | Disposition: A | Discharge status: Home / Self Care | Payer: 59 | Source: Ambulatory Visit | Attending: Orthopedic Surgery | Admitting: Orthopedic Surgery

## 2015-06-17 ENCOUNTER — Encounter (HOSPITAL_COMMUNITY): Payer: Self-pay

## 2015-06-17 DIAGNOSIS — Z01812 Encounter for preprocedural laboratory examination: Secondary | ICD-10-CM | POA: Diagnosis not present

## 2015-06-17 DIAGNOSIS — R9431 Abnormal electrocardiogram [ECG] [EKG]: Secondary | ICD-10-CM | POA: Insufficient documentation

## 2015-06-17 DIAGNOSIS — I4510 Unspecified right bundle-branch block: Secondary | ICD-10-CM | POA: Insufficient documentation

## 2015-06-17 DIAGNOSIS — Z01818 Encounter for other preprocedural examination: Secondary | ICD-10-CM | POA: Insufficient documentation

## 2015-06-17 DIAGNOSIS — Z0183 Encounter for blood typing: Secondary | ICD-10-CM | POA: Insufficient documentation

## 2015-06-17 DIAGNOSIS — M1711 Unilateral primary osteoarthritis, right knee: Secondary | ICD-10-CM | POA: Diagnosis not present

## 2015-06-17 LAB — COMPREHENSIVE METABOLIC PANEL
ALBUMIN: 4 g/dL (ref 3.5–5.0)
ALT: 53 U/L (ref 14–54)
AST: 36 U/L (ref 15–41)
Alkaline Phosphatase: 44 U/L (ref 38–126)
Anion gap: 12 (ref 5–15)
BILIRUBIN TOTAL: 0.6 mg/dL (ref 0.3–1.2)
BUN: 20 mg/dL (ref 6–20)
CO2: 20 mmol/L — ABNORMAL LOW (ref 22–32)
Calcium: 9.6 mg/dL (ref 8.9–10.3)
Chloride: 109 mmol/L (ref 101–111)
Creatinine, Ser: 0.87 mg/dL (ref 0.44–1.00)
GFR calc Af Amer: >60 mL/min (ref >60)
GFR calc non Af Amer: >60 mL/min (ref >60)
GLUCOSE: 105 mg/dL — AB (ref 65–99)
POTASSIUM: 4.5 mmol/L (ref 3.5–5.1)
Sodium: 141 mmol/L (ref 135–145)
Total Protein: 6.8 g/dL (ref 6.5–8.1)

## 2015-06-17 LAB — CBC WITH DIFFERENTIAL/PLATELET
BASOS ABS: 0.1 10*3/uL (ref 0.0–0.1)
BASOS PCT: 1 %
Eosinophils Absolute: 0.2 10*3/uL (ref 0.0–0.7)
Eosinophils Relative: 4 %
HEMATOCRIT: 41.9 % (ref 36.0–46.0)
HEMOGLOBIN: 14 g/dL (ref 12.0–15.0)
Lymphocytes Relative: 51 %
Lymphs Abs: 2.6 10*3/uL (ref 0.7–4.0)
MCH: 29.5 pg (ref 26.0–34.0)
MCHC: 33.4 g/dL (ref 30.0–36.0)
MCV: 88.2 fL (ref 78.0–100.0)
Monocytes Absolute: 0.4 10*3/uL (ref 0.1–1.0)
Monocytes Relative: 8 %
NEUTROS ABS: 1.8 10*3/uL (ref 1.7–7.7)
Neutrophils Relative %: 36 %
Platelets: 352 10*3/uL (ref 150–400)
RBC: 4.75 MIL/uL (ref 3.87–5.11)
RDW: 12.4 % (ref 11.5–15.5)
WBC: 5 10*3/uL (ref 4.0–10.5)

## 2015-06-17 LAB — SURGICAL PCR SCREEN
MRSA, PCR: NEGATIVE
STAPHYLOCOCCUS AUREUS: NEGATIVE

## 2015-06-17 LAB — PROTIME-INR
INR: 1.03 (ref 0.00–1.49)
Prothrombin Time: 13.7 seconds (ref 11.6–15.2)

## 2015-06-17 LAB — TYPE AND SCREEN
ABO/RH(D): A POS
ANTIBODY SCREEN: NEGATIVE

## 2015-06-17 LAB — ABO/RH: ABO/RH(D): A POS

## 2015-06-17 LAB — APTT: APTT: 24 s (ref 24–37)

## 2015-06-17 NOTE — Progress Notes (Signed)
PCP is Dr. Rachell Cipro Denies ever seeing a cardiologist. Denies ever having a stress test, card cath, or echo.

## 2015-06-18 LAB — URINE CULTURE

## 2015-06-18 NOTE — Progress Notes (Addendum)
Anesthesia Chart Review: Patient is a 54 year old female scheduled for right TKR on 06/28/15 by Dr. Noemi Chapel.   History includes former smoker, asthma, Hashimoto's thyroiditis, hypothyroidism, depression, GERD, arthritis, hypercholesterolemia, granula annulare (left face; Dr. Renda Rolls). BMI is consistent with morbid obesity. PCP is Dr. Rachell Cipro.   She is scheduled to see cardiologist Dr. Curt Bears for preoperative cardiology evaluation on 06/22/15.  Meds include albuterol, doxycycline, Cymbalta, fenofibrate, Advair, levothyroxine, loratadine, spironolactone, famotidine.  06/17/15 EKG: NSR, incomplete right BBB. Low r waves in inferior leads III and aVF.  Preoperative labs noted. Urine culture showed multiple species.   Will leave chart for follow-up cardiology pre-operative recommendations.  [facility] Short Stay Center/Anesthesiology Phone [phone] 06/18/2015 11:24 AM  Addendum: Patient was seen by Dr. Caryl Comes (not Dr. Curt Bears) for pre-operative evaluation. Following echo (see below), he felt she was ok to proceed with planned surgery.  06/23/15 Echo: Study Conclusions - Left ventricle: The cavity size was normal. There was mild  concentric hypertrophy. Systolic function was normal. The  estimated ejection fraction was in the range of 55% to 60%. Wall  motion was normal; there were no regional wall motion  abnormalities. Doppler parameters are consistent with abnormal  left ventricular relaxation (grade 1 diastolic dysfunction). - Left atrium: The atrium was mildly dilated.  [facility] Short Stay Center/Anesthesiology Phone [phone] 06/25/2015 9:42 AM

## 2015-06-21 NOTE — Progress Notes (Deleted)
Cardiology Office Note   Date:  06/21/2015   ID:  Nancy Burch, DOB 11-Feb-1961, MRN [id number]  PCP:  Rachell Cipro, MD  Cardiologist:   Constance Haw, MD    No chief complaint on file.    History of Present Illness: Nancy Burch is a 55 y.o. female who presents today for cardiology evaluation.   She presents as a pre-op for total knee arthroplasty.     Today, she denies*** symptoms of palpitations, chest pain, shortness of breath, orthopnea, PND, lower extremity edema, claudication, dizziness, presyncope, syncope, bleeding, or neurologic sequela. The patient is tolerating medications without difficulties and is otherwise without complaint today.    Past Medical History  Diagnosis Date  . Asthma   . Hypothyroidism   . Depression   . Arthritis   . GERD (gastroesophageal reflux disease)   . Primary localized osteoarthritis of right knee 06/16/2015  . Pure hypercholesterolemia 06/16/2015  . Hashimoto's thyroiditis 06/16/2015  . 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 Cesarean section  1985, 1989  . Biopsy thyroid  01/28/2013  . Wisdom tooth extraction       Current Outpatient Prescriptions  Medication Sig Dispense Refill  . albuterol (PROVENTIL HFA;VENTOLIN HFA) 108 (90 BASE) MCG/ACT inhaler Inhale 2 puffs into the lungs every 6 (six) hours as needed for wheezing. 1 Inhaler 2  . Cholecalciferol (VITAMIN D3) 5000 units TABS Take 1 tablet by mouth daily.    Marland Kitchen doxycycline (DORYX) 100 MG EC tablet Take 100 mg by mouth daily. with food  1  . DULoxetine (CYMBALTA) 60 MG capsule Take 1 capsule by mouth daily.  1  . famotidine (PEPCID) 10 MG tablet Take 10 mg by mouth 2 (two) times daily.    . fenofibrate 160 MG tablet Take 1 tablet by mouth daily.  3  . Fluticasone-Salmeterol (ADVAIR DISKUS) 100-50 MCG/DOSE AEPB Inhale 1 puff into the lungs 2 (two) times daily as needed.    Marland Kitchen  levothyroxine (SYNTHROID, LEVOTHROID) 75 MCG tablet Take 75 mcg by mouth daily before breakfast.    . loratadine (CLARITIN) 10 MG tablet Take 10 mg by mouth daily.    . meloxicam (MOBIC) 15 MG tablet Take 1 tablet by mouth daily.  1  . Multiple Vitamins-Minerals (MULTIVITAMIN WITH MINERALS) tablet Take 1 tablet by mouth daily.    Marland Kitchen spironolactone (ALDACTONE) 50 MG tablet Take 50 mg by mouth daily.  0  . tacrolimus (PROTOPIC) 0.1 % ointment Apply 1 application topically at bedtime.  1   No current facility-administered medications for this visit.    Allergies:   Milk-related compounds and Penicillins   Social History:  The patient  reports that she quit smoking about 9 years ago. She has never used smokeless tobacco. She reports that she drinks about 1.2 oz of alcohol per week. She reports that she does not use illicit drugs.   Family History:  The patient's family history includes Breast cancer in her mother; Colon cancer (age of onset: 37) in her sister; Diabetes in her sister; Melanoma in her father. There is no history of Stomach cancer.    ROS:  Please see the history of present illness.   Otherwise, review of systems is positive for ***.   All other systems are reviewed and negative.    PHYSICAL EXAM: VS:  LMP 08/16/2011 , BMI There is no weight on file to calculate BMI. GEN: Well  nourished, well developed, in no acute distress HEENT: normal Neck: no JVD, carotid bruits, or masses Cardiac: ***RRR; no murmurs, rubs, or gallops,no edema  Respiratory:  clear to auscultation bilaterally, normal work of breathing GI: soft, nontender, nondistended, + BS MS: no deformity or atrophy Skin: warm and dry Neuro:  Strength and sensation are intact Psych: euthymic mood, full affect  EKG:  EKG {ACTION; IS/IS [id number] ordered today. The ekg ordered today shows ***  Recent Labs: 06/17/2015: ALT 53; BUN 20; Creatinine, Ser 0.87; Hemoglobin 14.0; Platelets 352; Potassium 4.5; Sodium 141     Lipid Panel     Component Value Date/Time   CHOL 252* 06/13/2012 1619   TRIG 270* 06/13/2012 1619   HDL 50 06/13/2012 1619   CHOLHDL 5.0 06/13/2012 1619   VLDL 54* 06/13/2012 1619   LDLCALC 148* 06/13/2012 1619     Wt Readings from Last 3 Encounters:  06/17/15 245 lb 12.8 oz (111.494 kg)  01/29/13 230 lb (104.327 kg)  01/08/13 230 lb 12.8 oz (104.69 kg)      Other studies Reviewed: Additional studies/ records that were reviewed today include: ***  Review of the above records today demonstrates: ***   ASSESSMENT AND PLAN:  1.  Pre-operative assessment:    Current medicines are reviewed at length with the patient today.   The patient {ACTIONS; HAS/DOES NOT HAVE:19233} concerns regarding her medicines.  The following changes were made today:  {NONE DEFAULTED:18576::"none"}  Labs/ tests ordered today include: *** No orders of the defined types were placed in this encounter.     Disposition:   FU with *** {gen number [id number] {Days to years:10300}  Signed, Will Meredith Leeds, MD  06/21/2015 12:40 PM     [place] [address] [phone] (office) [phone] (fax)

## 2015-06-22 ENCOUNTER — Ambulatory Visit (INDEPENDENT_AMBULATORY_CARE_PROVIDER_SITE_OTHER): Payer: 59 | Admitting: Internal Medicine

## 2015-06-22 ENCOUNTER — Encounter: Payer: 59 | Admitting: Cardiology

## 2015-06-22 ENCOUNTER — Encounter: Payer: Self-pay | Admitting: Internal Medicine

## 2015-06-22 VITALS — BP 148/78 | HR 97 | Ht 64.0 in | Wt 248.8 lb

## 2015-06-22 DIAGNOSIS — I252 Old myocardial infarction: Secondary | ICD-10-CM | POA: Diagnosis not present

## 2015-06-22 NOTE — Patient Instructions (Signed)
Medication Instructions: - Your physician recommends that you continue on your current medications as directed. Please refer to the Current Medication list given to you today.  Labwork: - none  Procedures/Testing: - Your physician has requested that you have an echocardiogram- ASAP for surgical clearance (surgery scheduled for Monday 06/28/15). Echocardiography is a painless test that uses sound waves to create images of your heart. It provides your doctor with information about the size and shape of your heart and how well your heart's chambers and valves are working. This procedure takes approximately one hour. There are no restrictions for this procedure.  Follow-Up: - Dr. Caryl Comes will see you back on an as needed basis.  Any Additional Special Instructions Will Be Listed Below (If Applicable).     If you need a refill on your cardiac medications before your next appointment, please call your pharmacy.

## 2015-06-22 NOTE — Progress Notes (Signed)
ELECTROPHYSIOLOGY CONSULT NOTE  Patient ID: Nancy Burch, MRN: [id number], DOB/AGE: 1960-04-03 55 y.o. Admit date: (Not on file) Date of Consult: 06/22/2015  Primary Physician: Rachell Cipro, MD Primary Cardiologist: new Consulting Physician new  Chief Complaint: preoperative evaluation   HPI Nancy Burch is a 55 y.o. female  Seen because of an abnormal ECG during preoperative evaluation for knee replacement surgery.  She has a history of osteoarthritis. She has developed Hashimoto's thyroiditis and a concomitant 50 pound weight gain. This is aggravated the former.  She has noted no significant chest discomfort with exertion. Exercise is largely limited by her knees, however, as her weight has changed over the last couple of years dyspnea has accompanied this. Still, she is able to climb a flight of stairs, she is able to bring in her groceries and her husband has to tell her to slow down while walking. She is able to walk a couple 100 yards. She does not have peripheral edema nocturnal dyspnea or orthopnea.  Cardiac risk factors are notable for dyslipidemia. I suspect she has hypertension which is being masked by her sprironolactone. Family history is negative.  She has sleep disordered breathing  Past Medical History  Diagnosis Date  . Asthma   . Hypothyroidism   . Depression   . Arthritis   . GERD (gastroesophageal reflux disease)   . Primary localized osteoarthritis of right knee 06/16/2015  . Pure hypercholesterolemia 06/16/2015  . Hashimoto's thyroiditis 06/16/2015  . 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 Cesarean section  1985, 1989  . Biopsy thyroid  01/28/2013  . Wisdom tooth extraction       Home Meds: Prior to Admission medications   Medication Sig Start Date End Date Taking? Authorizing Provider  albuterol (PROVENTIL HFA;VENTOLIN  HFA) 108 (90 BASE) MCG/ACT inhaler Inhale 2 puffs into the lungs every 6 (six) hours as needed for wheezing. 05/30/12  Yes Huel Cote, NP  Cholecalciferol (VITAMIN D3) 5000 units TABS Take 1 tablet by mouth daily.   Yes Historical Provider, MD  doxycycline (DORYX) 100 MG EC tablet Take 100 mg by mouth daily. with food 05/20/15  Yes Historical Provider, MD  DULoxetine (CYMBALTA) 60 MG capsule Take 1 capsule by mouth daily. 04/01/15  Yes Historical Provider, MD  famotidine (PEPCID) 10 MG tablet Take 10 mg by mouth 2 (two) times daily.   Yes Historical Provider, MD  fenofibrate 160 MG tablet Take 1 tablet by mouth daily. 05/11/15  Yes Historical Provider, MD  Fluticasone-Salmeterol (ADVAIR DISKUS) 100-50 MCG/DOSE AEPB Inhale 1 puff into the lungs 2 (two) times daily as needed.   Yes Historical Provider, MD  levothyroxine (SYNTHROID, LEVOTHROID) 75 MCG tablet Take 75 mcg by mouth daily before breakfast.   Yes Historical Provider, MD  loratadine (CLARITIN) 10 MG tablet Take 10 mg by mouth daily.   Yes Historical Provider, MD  meloxicam (MOBIC) 15 MG tablet Take 1 tablet by mouth daily. 05/17/15  Yes Historical Provider, MD  Multiple Vitamins-Minerals (MULTIVITAMIN WITH MINERALS) tablet Take 1 tablet by mouth daily.   Yes Historical Provider, MD  spironolactone (ALDACTONE) 50 MG tablet Take 50 mg by mouth daily. 05/20/15  Yes Historical Provider, MD  tacrolimus (PROTOPIC) 0.1 % ointment Apply 1 application topically at bedtime. 05/21/15  Yes Historical Provider, MD    Allergies:  Allergies  Allergen Reactions  . Milk-Related  Compounds     Stomach cramps, diarrhea  . Penicillins Hives    Social History   Social History  . Marital Status: Married    Spouse Name: Johnnette Litter  . Number of Children: N/A  . Years of Education: N/A   Occupational History  . controller    Social History Main Topics  . Smoking status: Former Smoker    Quit date: 06/27/2005  . Smokeless tobacco: Never Used  . Alcohol  Use: 1.2 oz/week    2 Glasses of wine per week     Comment: 2 galsses per month  . Drug Use: No  . Sexual Activity: Yes   Other Topics Concern  . Not on file   Social History Narrative     Family History  Problem Relation Age of Onset  . Breast cancer Mother   . Melanoma Father   . Diabetes Sister   . Colon cancer Sister 18  . Stomach cancer Neg Hx      ROS:  Please see the history of present illness.     All other systems reviewed and negative.    Physical Exam: Blood pressure 148/78, pulse 97, height 5\' 4"  (1.626 m), weight 248 lb 12.8 oz (112.855 kg), last menstrual period 08/16/2011, SpO2 97 %. General: Well developed, well nourished female in no acute distress. Head: Normocephalic, atraumatic, sclera non-icteric, no xanthomas, nares are without discharge. EENT: normal  Lymph Nodes:  none Neck: Negative for carotid bruits. JVD not elevated. Back:without scoliosis kyphosis  Lungs: Clear bilaterally to auscultation without wheezes, rales, or rhonchi. Breathing is unlabored. Heart: RRR with S1 S2. No murmur . No rubs, or gallops appreciated. Abdomen: Soft, non-tender, non-distended with normoactive bowel sounds. No hepatomegaly. No rebound/guarding. No obvious abdominal masses. Msk:  Strength and tone appear normal for age. Extremities: No clubbing or cyanosis. No edema.  Distal pedal pulses are 2+ and equal bilaterally. Skin: Warm and Dry Neuro: Alert and oriented X 3. CN III-XII intact Grossly normal sensory and motor function . Psych:  Responds to questions appropriately with a normal affect.      Labs: Cardiac Enzymes No results for input(s): CKTOTAL, CKMB, TROPONINI in the last 72 hours. CBC Lab Results  Component Value Date   WBC 5.0 06/17/2015   HGB 14.0 06/17/2015   HCT 41.9 06/17/2015   MCV 88.2 06/17/2015   PLT 352 06/17/2015   PROTIME: No results for input(s): LABPROT, INR in the last 72 hours. Chemistry  Recent Labs Lab 06/17/15 0833  NA 141    K 4.5  CL 109  CO2 20*  BUN 20  CREATININE 0.87  CALCIUM 9.6  PROT 6.8  BILITOT 0.6  ALKPHOS 44  ALT 53  AST 36  GLUCOSE 105*   Lipids Lab Results  Component Value Date   CHOL 252* 06/13/2012   HDL 50 06/13/2012   LDLCALC 148* 06/13/2012   TRIG 270* 06/13/2012   BNP No results found for: PROBNP Thyroid Function Tests: No results for input(s): TSH, T4TOTAL, T3FREE, THYROIDAB in the last 72 hours.  Invalid input(s): FREET3 Miscellaneous No results found for: DDIMER  Radiology/Studies:  No results found.  EKG:  Sinus rhythm at 74 Intervals 15/09/40 Axis left -6 Incomplete right bundle branch block Otherwise normal   Assessment and Plan:  Preoperative cardiac evaluation  Abnormal ECG  HTN  Sleep disordered breathing  Dyslipidemia  Anticipated knee surgery. Functional status is in excess of 4 METs. ECG abnormality I think is not as described; there  are small R waves in the inferior leads. We will obtain an echocardiogram to assure that no segmental wall motion abnormalities as inferior Q waves can give way to R waves.  She does have multiple cardiac risk factors include hypertension and dyslipidemia. Will defer management to her primary care provider.  May need sleep study   Virl Axe

## 2015-06-23 ENCOUNTER — Other Ambulatory Visit: Payer: Self-pay

## 2015-06-23 ENCOUNTER — Ambulatory Visit (HOSPITAL_COMMUNITY): Payer: 59 | Attending: Internal Medicine

## 2015-06-23 DIAGNOSIS — Z87891 Personal history of nicotine dependence: Secondary | ICD-10-CM | POA: Insufficient documentation

## 2015-06-23 DIAGNOSIS — E669 Obesity, unspecified: Secondary | ICD-10-CM | POA: Diagnosis not present

## 2015-06-23 DIAGNOSIS — Z6841 Body Mass Index (BMI) 40.0 and over, adult: Secondary | ICD-10-CM | POA: Insufficient documentation

## 2015-06-23 DIAGNOSIS — E785 Hyperlipidemia, unspecified: Secondary | ICD-10-CM | POA: Insufficient documentation

## 2015-06-23 DIAGNOSIS — I119 Hypertensive heart disease without heart failure: Secondary | ICD-10-CM | POA: Insufficient documentation

## 2015-06-23 DIAGNOSIS — I213 ST elevation (STEMI) myocardial infarction of unspecified site: Secondary | ICD-10-CM | POA: Diagnosis present

## 2015-06-23 DIAGNOSIS — I252 Old myocardial infarction: Secondary | ICD-10-CM | POA: Diagnosis not present

## 2015-06-23 NOTE — Progress Notes (Signed)
This encounter was created in error - please disregard.

## 2015-06-25 ENCOUNTER — Encounter: Payer: Self-pay | Admitting: Internal Medicine

## 2015-06-25 NOTE — Progress Notes (Signed)
Echo report/ Dr. Olin Pia office note from 06/22/15 faxed to Dr. Josue Hector at [phone] for surgical clearance. Confirmation received.

## 2015-06-27 MED ORDER — SODIUM CHLORIDE 0.9 % IV SOLN
1500.0000 mg | INTRAVENOUS | Status: AC
  Administered 2015-06-28 (×2): 1500 mg via INTRAVENOUS
  Filled 2015-06-27: qty 1500

## 2015-06-28 ENCOUNTER — Inpatient Hospital Stay (HOSPITAL_COMMUNITY): Payer: 59 | Admitting: Vascular Surgery

## 2015-06-28 ENCOUNTER — Encounter (HOSPITAL_COMMUNITY)
Admission: RE | Disposition: A | Discharge status: Home Health | Payer: Self-pay | Source: Ambulatory Visit | Attending: Orthopedic Surgery

## 2015-06-28 ENCOUNTER — Inpatient Hospital Stay (HOSPITAL_COMMUNITY)
Admission: RE | Admit: 2015-06-28 | Discharge: 2015-06-30 | DRG: 470 | Disposition: A | Discharge status: Home Health | Payer: 59 | Source: Ambulatory Visit | Attending: Orthopedic Surgery | Admitting: Orthopedic Surgery

## 2015-06-28 ENCOUNTER — Encounter (HOSPITAL_COMMUNITY): Payer: Self-pay | Admitting: *Deleted

## 2015-06-28 ENCOUNTER — Inpatient Hospital Stay (HOSPITAL_COMMUNITY): Payer: 59 | Admitting: Certified Registered Nurse Anesthetist

## 2015-06-28 DIAGNOSIS — Z6841 Body Mass Index (BMI) 40.0 and over, adult: Secondary | ICD-10-CM

## 2015-06-28 DIAGNOSIS — K219 Gastro-esophageal reflux disease without esophagitis: Secondary | ICD-10-CM | POA: Diagnosis present

## 2015-06-28 DIAGNOSIS — M1711 Unilateral primary osteoarthritis, right knee: Secondary | ICD-10-CM | POA: Diagnosis present

## 2015-06-28 DIAGNOSIS — E7800 Pure hypercholesterolemia, unspecified: Secondary | ICD-10-CM | POA: Diagnosis present

## 2015-06-28 DIAGNOSIS — Z87891 Personal history of nicotine dependence: Secondary | ICD-10-CM

## 2015-06-28 DIAGNOSIS — Z96659 Presence of unspecified artificial knee joint: Secondary | ICD-10-CM

## 2015-06-28 DIAGNOSIS — F329 Major depressive disorder, single episode, unspecified: Secondary | ICD-10-CM | POA: Diagnosis present

## 2015-06-28 DIAGNOSIS — M25561 Pain in right knee: Secondary | ICD-10-CM | POA: Diagnosis present

## 2015-06-28 DIAGNOSIS — J45909 Unspecified asthma, uncomplicated: Secondary | ICD-10-CM | POA: Diagnosis present

## 2015-06-28 DIAGNOSIS — E063 Autoimmune thyroiditis: Secondary | ICD-10-CM | POA: Diagnosis present

## 2015-06-28 DIAGNOSIS — L929 Granulomatous disorder of the skin and subcutaneous tissue, unspecified: Secondary | ICD-10-CM | POA: Diagnosis present

## 2015-06-28 DIAGNOSIS — F32A Depression, unspecified: Secondary | ICD-10-CM | POA: Diagnosis present

## 2015-06-28 HISTORY — PX: TOTAL KNEE ARTHROPLASTY: SHX125

## 2015-06-28 HISTORY — DX: Unilateral primary osteoarthritis, right knee: M17.11

## 2015-06-28 HISTORY — DX: Pure hypercholesterolemia, unspecified: E78.00

## 2015-06-28 HISTORY — DX: Autoimmune thyroiditis: E06.3

## 2015-06-28 HISTORY — DX: Granulomatous disorder of the skin and subcutaneous tissue, unspecified: L92.9

## 2015-06-28 LAB — CBC
HEMATOCRIT: 38.5 % (ref 36.0–46.0)
HEMOGLOBIN: 13 g/dL (ref 12.0–15.0)
MCH: 29.7 pg (ref 26.0–34.0)
MCHC: 33.8 g/dL (ref 30.0–36.0)
MCV: 87.9 fL (ref 78.0–100.0)
Platelets: 331 10*3/uL (ref 150–400)
RBC: 4.38 MIL/uL (ref 3.87–5.11)
RDW: 12.4 % (ref 11.5–15.5)
WBC: 7.1 10*3/uL (ref 4.0–10.5)

## 2015-06-28 LAB — CREATININE, SERUM
Creatinine, Ser: 1.04 mg/dL — ABNORMAL HIGH (ref 0.44–1.00)
GFR calc Af Amer: >60 mL/min (ref >60)
GFR, EST NON AFRICAN AMERICAN: 59 mL/min — AB (ref >60)

## 2015-06-28 SURGERY — ARTHROPLASTY, KNEE, TOTAL
Anesthesia: Spinal | Site: Knee | Laterality: Right

## 2015-06-28 MED ORDER — DEXAMETHASONE SODIUM PHOSPHATE 10 MG/ML IJ SOLN
INTRAMUSCULAR | Status: DC | PRN
  Administered 2015-06-28: 10 mg via INTRAVENOUS

## 2015-06-28 MED ORDER — OXYCODONE HCL ER 20 MG PO T12A
20.0000 mg | EXTENDED_RELEASE_TABLET | Freq: Two times a day (BID) | ORAL | Status: DC
  Administered 2015-06-29 – 2015-06-30 (×3): 20 mg via ORAL
  Filled 2015-06-28 (×3): qty 1

## 2015-06-28 MED ORDER — LORATADINE 10 MG PO TABS
10.0000 mg | ORAL_TABLET | Freq: Every day | ORAL | Status: DC
  Administered 2015-06-29 – 2015-06-30 (×2): 10 mg via ORAL
  Filled 2015-06-28 (×3): qty 1

## 2015-06-28 MED ORDER — DIPHENHYDRAMINE HCL 50 MG/ML IJ SOLN
INTRAMUSCULAR | Status: AC
  Filled 2015-06-28: qty 1

## 2015-06-28 MED ORDER — PROPOFOL 10 MG/ML IV BOLUS
INTRAVENOUS | Status: DC | PRN
  Administered 2015-06-28 (×2): 20 mg via INTRAVENOUS

## 2015-06-28 MED ORDER — DEXAMETHASONE SODIUM PHOSPHATE 10 MG/ML IJ SOLN
10.0000 mg | Freq: Three times a day (TID) | INTRAMUSCULAR | Status: AC
  Administered 2015-06-28 – 2015-06-29 (×4): 10 mg via INTRAVENOUS
  Filled 2015-06-28 (×4): qty 1

## 2015-06-28 MED ORDER — ONDANSETRON HCL 4 MG/2ML IJ SOLN: 4.0000 mg | Freq: Once | INTRAMUSCULAR | Status: DC | PRN

## 2015-06-28 MED ORDER — FENOFIBRATE 160 MG PO TABS
160.0000 mg | ORAL_TABLET | Freq: Every day | ORAL | Status: DC
  Administered 2015-06-28 – 2015-06-30 (×3): 160 mg via ORAL
  Filled 2015-06-28 (×3): qty 1

## 2015-06-28 MED ORDER — PHENYLEPHRINE HCL 10 MG/ML IJ SOLN
10.0000 mg | INTRAVENOUS | Status: DC | PRN
  Administered 2015-06-28: 20 ug/min via INTRAVENOUS

## 2015-06-28 MED ORDER — ONDANSETRON HCL 4 MG PO TABS
4.0000 mg | ORAL_TABLET | Freq: Four times a day (QID) | ORAL | Status: DC | PRN
Start: 2015-06-28 — End: 2015-06-30

## 2015-06-28 MED ORDER — DOCUSATE SODIUM 100 MG PO CAPS
100.0000 mg | ORAL_CAPSULE | Freq: Two times a day (BID) | ORAL | Status: DC
  Administered 2015-06-28 – 2015-06-30 (×4): 100 mg via ORAL
  Filled 2015-06-28 (×4): qty 1

## 2015-06-28 MED ORDER — SUCCINYLCHOLINE CHLORIDE 200 MG/10ML IV SOSY
PREFILLED_SYRINGE | INTRAVENOUS | Status: AC
  Filled 2015-06-28: qty 10

## 2015-06-28 MED ORDER — FAMOTIDINE 20 MG PO TABS
10.0000 mg | ORAL_TABLET | Freq: Two times a day (BID) | ORAL | Status: DC
  Administered 2015-06-28 – 2015-06-30 (×4): 10 mg via ORAL
  Filled 2015-06-28 (×5): qty 1

## 2015-06-28 MED ORDER — MOMETASONE FURO-FORMOTEROL FUM 100-5 MCG/ACT IN AERO
2.0000 | INHALATION_SPRAY | Freq: Two times a day (BID) | RESPIRATORY_TRACT | Status: DC
  Administered 2015-06-28 – 2015-06-30 (×4): 2 via RESPIRATORY_TRACT
  Filled 2015-06-28: qty 8.8

## 2015-06-28 MED ORDER — OXYCODONE HCL 5 MG PO TABS
ORAL_TABLET | ORAL | Status: AC
  Filled 2015-06-28: qty 2

## 2015-06-28 MED ORDER — BUPIVACAINE-EPINEPHRINE 0.25% -1:200000 IJ SOLN
INTRAMUSCULAR | Status: DC | PRN
  Administered 2015-06-28: 30 mL

## 2015-06-28 MED ORDER — PROPOFOL 500 MG/50ML IV EMUL
INTRAVENOUS | Status: DC | PRN
  Administered 2015-06-28: 50 ug/kg/min via INTRAVENOUS
  Administered 2015-06-28: 14:00:00 via INTRAVENOUS

## 2015-06-28 MED ORDER — ENOXAPARIN SODIUM 30 MG/0.3ML ~~LOC~~ SOLN
30.0000 mg | Freq: Two times a day (BID) | SUBCUTANEOUS | Status: DC
  Administered 2015-06-29 – 2015-06-30 (×3): 30 mg via SUBCUTANEOUS
  Filled 2015-06-28 (×3): qty 0.3

## 2015-06-28 MED ORDER — LIDOCAINE 2% (20 MG/ML) 5 ML SYRINGE
INTRAMUSCULAR | Status: AC
  Filled 2015-06-28: qty 5

## 2015-06-28 MED ORDER — LACTATED RINGERS IV SOLN
INTRAVENOUS | Status: DC
  Administered 2015-06-28: 11:00:00 via INTRAVENOUS

## 2015-06-28 MED ORDER — CHLORHEXIDINE GLUCONATE 4 % EX LIQD: 60.0000 mL | Freq: Once | CUTANEOUS | Status: DC

## 2015-06-28 MED ORDER — FENTANYL CITRATE (PF) 100 MCG/2ML IJ SOLN: 25.0000 ug | INTRAMUSCULAR | Status: DC | PRN

## 2015-06-28 MED ORDER — CELECOXIB 200 MG PO CAPS
200.0000 mg | ORAL_CAPSULE | Freq: Two times a day (BID) | ORAL | Status: DC
  Administered 2015-06-28 – 2015-06-30 (×4): 200 mg via ORAL
  Filled 2015-06-28 (×4): qty 1

## 2015-06-28 MED ORDER — DOXYCYCLINE HYCLATE 100 MG PO TABS
100.0000 mg | ORAL_TABLET | Freq: Every day | ORAL | Status: DC
  Administered 2015-06-28 – 2015-06-30 (×3): 100 mg via ORAL
  Filled 2015-06-28 (×3): qty 1

## 2015-06-28 MED ORDER — METOCLOPRAMIDE HCL 5 MG PO TABS: 5.0000 mg | ORAL_TABLET | Freq: Three times a day (TID) | ORAL | Status: DC | PRN

## 2015-06-28 MED ORDER — BUPIVACAINE IN DEXTROSE 0.75-8.25 % IT SOLN
INTRATHECAL | Status: DC | PRN
  Administered 2015-06-28: 2 mL via INTRATHECAL

## 2015-06-28 MED ORDER — FENTANYL CITRATE (PF) 100 MCG/2ML IJ SOLN: 100.0000 ug | Freq: Once | INTRAMUSCULAR | Status: DC

## 2015-06-28 MED ORDER — HYDROMORPHONE HCL 1 MG/ML IJ SOLN
1.0000 mg | INTRAMUSCULAR | Status: DC | PRN
  Administered 2015-06-28 – 2015-06-29 (×2): 1 mg via INTRAVENOUS
  Filled 2015-06-28 (×2): qty 1

## 2015-06-28 MED ORDER — MIDAZOLAM HCL 2 MG/2ML IJ SOLN
INTRAMUSCULAR | Status: AC
  Filled 2015-06-28: qty 2

## 2015-06-28 MED ORDER — 0.9 % SODIUM CHLORIDE (POUR BTL) OPTIME
TOPICAL | Status: DC | PRN
  Administered 2015-06-28: 1000 mL

## 2015-06-28 MED ORDER — DIPHENHYDRAMINE HCL 12.5 MG/5ML PO ELIX
12.5000 mg | ORAL_SOLUTION | ORAL | Status: DC | PRN
Start: 2015-06-28 — End: 2015-06-30

## 2015-06-28 MED ORDER — POTASSIUM CHLORIDE IN NACL 20-0.9 MEQ/L-% IV SOLN: INTRAVENOUS | Status: DC

## 2015-06-28 MED ORDER — LACTATED RINGERS IV SOLN
INTRAVENOUS | Status: DC
  Administered 2015-06-28 (×3): via INTRAVENOUS

## 2015-06-28 MED ORDER — SODIUM CHLORIDE 0.9 % IR SOLN
Status: DC | PRN
  Administered 2015-06-28: 3000 mL

## 2015-06-28 MED ORDER — ONDANSETRON HCL 4 MG/2ML IJ SOLN
INTRAMUSCULAR | Status: AC
  Filled 2015-06-28: qty 2

## 2015-06-28 MED ORDER — MIDAZOLAM HCL 2 MG/2ML IJ SOLN
2.0000 mg | Freq: Once | INTRAMUSCULAR | Status: AC
  Administered 2015-06-28: 2 mg via INTRAVENOUS

## 2015-06-28 MED ORDER — FENTANYL CITRATE (PF) 100 MCG/2ML IJ SOLN
INTRAMUSCULAR | Status: DC | PRN
  Administered 2015-06-28 (×2): 50 ug via INTRAVENOUS

## 2015-06-28 MED ORDER — FENTANYL CITRATE (PF) 100 MCG/2ML IJ SOLN
INTRAMUSCULAR | Status: AC
  Administered 2015-06-28: 50 ug
  Filled 2015-06-28: qty 2

## 2015-06-28 MED ORDER — MENTHOL 3 MG MT LOZG: 1.0000 | LOZENGE | OROMUCOSAL | Status: DC | PRN

## 2015-06-28 MED ORDER — OXYCODONE HCL 5 MG PO TABS
5.0000 mg | ORAL_TABLET | ORAL | Status: DC | PRN
  Administered 2015-06-28 – 2015-06-30 (×7): 10 mg via ORAL
  Filled 2015-06-28 (×7): qty 2

## 2015-06-28 MED ORDER — DULOXETINE HCL 60 MG PO CPEP
60.0000 mg | ORAL_CAPSULE | Freq: Every day | ORAL | Status: DC
  Administered 2015-06-28 – 2015-06-30 (×3): 60 mg via ORAL
  Filled 2015-06-28 (×3): qty 1

## 2015-06-28 MED ORDER — ALBUTEROL SULFATE (2.5 MG/3ML) 0.083% IN NEBU: 2.5000 mg | INHALATION_SOLUTION | Freq: Four times a day (QID) | RESPIRATORY_TRACT | Status: DC | PRN

## 2015-06-28 MED ORDER — VITAMIN D 1000 UNITS PO TABS
5000.0000 [IU] | ORAL_TABLET | Freq: Every day | ORAL | Status: DC
  Administered 2015-06-28 – 2015-06-30 (×3): 5000 [IU] via ORAL
  Filled 2015-06-28 (×6): qty 5

## 2015-06-28 MED ORDER — ALUM & MAG HYDROXIDE-SIMETH 200-200-20 MG/5ML PO SUSP: 30.0000 mL | ORAL | Status: DC | PRN

## 2015-06-28 MED ORDER — ONDANSETRON HCL 4 MG/2ML IJ SOLN
INTRAMUSCULAR | Status: DC | PRN
  Administered 2015-06-28: 4 mg via INTRAVENOUS

## 2015-06-28 MED ORDER — VANCOMYCIN HCL IN DEXTROSE 1-5 GM/200ML-% IV SOLN
1000.0000 mg | Freq: Two times a day (BID) | INTRAVENOUS | Status: AC
  Administered 2015-06-28: 1000 mg via INTRAVENOUS
  Filled 2015-06-28: qty 200

## 2015-06-28 MED ORDER — LEVOTHYROXINE SODIUM 75 MCG PO TABS
75.0000 ug | ORAL_TABLET | Freq: Every day | ORAL | Status: DC
  Administered 2015-06-29 – 2015-06-30 (×2): 75 ug via ORAL
  Filled 2015-06-28 (×2): qty 1

## 2015-06-28 MED ORDER — FENTANYL CITRATE (PF) 250 MCG/5ML IJ SOLN
INTRAMUSCULAR | Status: AC
  Filled 2015-06-28: qty 5

## 2015-06-28 MED ORDER — BUPIVACAINE-EPINEPHRINE (PF) 0.25% -1:200000 IJ SOLN
INTRAMUSCULAR | Status: AC
  Filled 2015-06-28: qty 30

## 2015-06-28 MED ORDER — MIDAZOLAM HCL 5 MG/5ML IJ SOLN
INTRAMUSCULAR | Status: DC | PRN
  Administered 2015-06-28: 2 mg via INTRAVENOUS

## 2015-06-28 MED ORDER — DIPHENHYDRAMINE HCL 50 MG/ML IJ SOLN
INTRAMUSCULAR | Status: DC | PRN
  Administered 2015-06-28: 25 mg via INTRAVENOUS

## 2015-06-28 MED ORDER — PHENYLEPHRINE HCL 10 MG/ML IJ SOLN
INTRAMUSCULAR | Status: DC | PRN
  Administered 2015-06-28: 80 ug via INTRAVENOUS
  Administered 2015-06-28: 120 ug via INTRAVENOUS

## 2015-06-28 MED ORDER — ONDANSETRON HCL 4 MG/2ML IJ SOLN: 4.0000 mg | Freq: Four times a day (QID) | INTRAMUSCULAR | Status: DC | PRN

## 2015-06-28 MED ORDER — POLYETHYLENE GLYCOL 3350 17 G PO PACK
17.0000 g | PACK | Freq: Two times a day (BID) | ORAL | Status: DC
  Administered 2015-06-30: 17 g via ORAL
  Filled 2015-06-28 (×2): qty 1

## 2015-06-28 MED ORDER — DEXAMETHASONE SODIUM PHOSPHATE 10 MG/ML IJ SOLN
INTRAMUSCULAR | Status: AC
  Filled 2015-06-28: qty 1

## 2015-06-28 MED ORDER — PHENOL 1.4 % MT LIQD: 1.0000 | OROMUCOSAL | Status: DC | PRN

## 2015-06-28 MED ORDER — POVIDONE-IODINE 7.5 % EX SOLN: Freq: Once | CUTANEOUS | Status: DC

## 2015-06-28 MED ORDER — ACETAMINOPHEN 650 MG RE SUPP: 650.0000 mg | Freq: Four times a day (QID) | RECTAL | Status: DC | PRN

## 2015-06-28 MED ORDER — ACETAMINOPHEN 325 MG PO TABS: 650.0000 mg | ORAL_TABLET | Freq: Four times a day (QID) | ORAL | Status: DC | PRN

## 2015-06-28 MED ORDER — METOCLOPRAMIDE HCL 5 MG/ML IJ SOLN: 5.0000 mg | Freq: Three times a day (TID) | INTRAMUSCULAR | Status: DC | PRN

## 2015-06-28 MED ORDER — TACROLIMUS 0.1 % EX OINT: 1.0000 "application " | TOPICAL_OINTMENT | Freq: Every day | CUTANEOUS | Status: DC

## 2015-06-28 SURGICAL SUPPLY — 67 items
BANDAGE ESMARK 6X9 LF (GAUZE/BANDAGES/DRESSINGS) ×1 IMPLANT
BENZOIN TINCTURE PRP APPL 2/3 (GAUZE/BANDAGES/DRESSINGS) ×3 IMPLANT
BLADE SAGITTAL 25.0X1.19X90 (BLADE) ×2 IMPLANT
BLADE SAGITTAL 25.0X1.19X90MM (BLADE) ×1
BLADE SAW RECIP 87.9 MT (BLADE) IMPLANT
BLADE SAW SGTL 13X75X1.27 (BLADE) ×3 IMPLANT
BLADE SURG 10 STRL SS (BLADE) ×6 IMPLANT
BNDG ELASTIC 6X15 VLCR STRL LF (GAUZE/BANDAGES/DRESSINGS) ×3 IMPLANT
BNDG ESMARK 6X9 LF (GAUZE/BANDAGES/DRESSINGS) ×3
BOWL SMART MIX CTS (DISPOSABLE) ×3 IMPLANT
CAPT KNEE TOTAL 3 ATTUNE ×3 IMPLANT
CEMENT HV SMART SET (Cement) ×6 IMPLANT
CLOSURE WOUND 1/2 X4 (GAUZE/BANDAGES/DRESSINGS) ×1
COVER SURGICAL LIGHT HANDLE (MISCELLANEOUS) ×3 IMPLANT
CUFF TOURNIQUET SINGLE 34IN LL (TOURNIQUET CUFF) ×3 IMPLANT
CUFF TOURNIQUET SINGLE 44IN (TOURNIQUET CUFF) IMPLANT
DECANTER SPIKE VIAL GLASS SM (MISCELLANEOUS) IMPLANT
DRAPE EXTREMITY T 121X128X90 (DRAPE) ×3 IMPLANT
DRAPE INCISE IOBAN 66X45 STRL (DRAPES) ×6 IMPLANT
DRAPE PROXIMA HALF (DRAPES) ×3 IMPLANT
DRAPE U-SHAPE 47X51 STRL (DRAPES) ×3 IMPLANT
DRSG AQUACEL AG ADV 3.5X14 (GAUZE/BANDAGES/DRESSINGS) ×3 IMPLANT
DURAPREP 26ML APPLICATOR (WOUND CARE) ×3 IMPLANT
ELECT CAUTERY BLADE 6.4 (BLADE) ×3 IMPLANT
ELECT REM PT RETURN 9FT ADLT (ELECTROSURGICAL) ×3
ELECTRODE REM PT RTRN 9FT ADLT (ELECTROSURGICAL) ×1 IMPLANT
FACESHIELD WRAPAROUND (MASK) ×3 IMPLANT
GLOVE BIO SURGEON STRL SZ7 (GLOVE) ×3 IMPLANT
GLOVE BIOGEL PI IND STRL 7.0 (GLOVE) ×1 IMPLANT
GLOVE BIOGEL PI IND STRL 7.5 (GLOVE) ×1 IMPLANT
GLOVE BIOGEL PI INDICATOR 7.0 (GLOVE) ×2
GLOVE BIOGEL PI INDICATOR 7.5 (GLOVE) ×2
GLOVE SS BIOGEL STRL SZ 7.5 (GLOVE) ×1 IMPLANT
GLOVE SUPERSENSE BIOGEL SZ 7.5 (GLOVE) ×2
GOWN STRL REUS W/ TWL LRG LVL3 (GOWN DISPOSABLE) ×1 IMPLANT
GOWN STRL REUS W/ TWL XL LVL3 (GOWN DISPOSABLE) ×2 IMPLANT
GOWN STRL REUS W/TWL LRG LVL3 (GOWN DISPOSABLE) ×2
GOWN STRL REUS W/TWL XL LVL3 (GOWN DISPOSABLE) ×4
HANDPIECE INTERPULSE COAX TIP (DISPOSABLE) ×2
HOOD PEEL AWAY FACE SHEILD DIS (HOOD) ×6 IMPLANT
IMMOBILIZER KNEE 22 UNIV (SOFTGOODS) ×3 IMPLANT
KIT BASIN OR (CUSTOM PROCEDURE TRAY) ×3 IMPLANT
KIT ROOM TURNOVER OR (KITS) ×3 IMPLANT
MANIFOLD NEPTUNE II (INSTRUMENTS) ×3 IMPLANT
MARKER SKIN DUAL TIP RULER LAB (MISCELLANEOUS) ×3 IMPLANT
NEEDLE 18GX1X1/2 (RX/OR ONLY) (NEEDLE) ×3 IMPLANT
NS IRRIG 1000ML POUR BTL (IV SOLUTION) ×3 IMPLANT
PACK TOTAL JOINT (CUSTOM PROCEDURE TRAY) ×3 IMPLANT
PAD ARMBOARD 7.5X6 YLW CONV (MISCELLANEOUS) ×3 IMPLANT
SET HNDPC FAN SPRY TIP SCT (DISPOSABLE) ×1 IMPLANT
STRIP CLOSURE SKIN 1/2X4 (GAUZE/BANDAGES/DRESSINGS) ×2 IMPLANT
SUCTION FRAZIER HANDLE 10FR (MISCELLANEOUS) ×2
SUCTION TUBE FRAZIER 10FR DISP (MISCELLANEOUS) ×1 IMPLANT
SUT MNCRL AB 3-0 PS2 18 (SUTURE) ×3 IMPLANT
SUT VIC AB 0 CT1 27 (SUTURE) ×6
SUT VIC AB 0 CT1 27XBRD ANBCTR (SUTURE) ×3 IMPLANT
SUT VIC AB 1 CT1 27 (SUTURE) ×2
SUT VIC AB 1 CT1 27XBRD ANBCTR (SUTURE) ×1 IMPLANT
SUT VIC AB 2-0 CT1 27 (SUTURE) ×4
SUT VIC AB 2-0 CT1 TAPERPNT 27 (SUTURE) ×2 IMPLANT
SYR 30ML LL (SYRINGE) ×3 IMPLANT
TOWEL OR 17X24 6PK STRL BLUE (TOWEL DISPOSABLE) ×3 IMPLANT
TOWEL OR 17X26 10 PK STRL BLUE (TOWEL DISPOSABLE) ×3 IMPLANT
TRAY FOLEY CATH 16FR SILVER (SET/KITS/TRAYS/PACK) ×3 IMPLANT
TUBE CONNECTING 12'X1/4 (SUCTIONS) ×1
TUBE CONNECTING 12X1/4 (SUCTIONS) ×2 IMPLANT
YANKAUER SUCT BULB TIP NO VENT (SUCTIONS) ×3 IMPLANT

## 2015-06-28 NOTE — Progress Notes (Signed)
Orthopedic Tech Progress Note Patient Details:  Nancy Burch 1960/05/06 [id number]  CPM Right Knee CPM Right Knee: On Right Knee Flexion (Degrees): 90 Right Knee Extension (Degrees): 0 Additional Comments: Trapeze bar and foot roll   [place] Pink 06/28/2015, 5:07 PM

## 2015-06-28 NOTE — Anesthesia Preprocedure Evaluation (Addendum)
Anesthesia Evaluation  Patient identified by MRN, date of birth, ID band Patient awake    Reviewed: Allergy & Precautions, NPO status   History of Anesthesia Complications Negative for: history of anesthetic complications  Airway Mallampati: II  TM Distance: >3 FB Neck ROM: Full    Dental no notable dental hx. (+) Dental Advisory Given   Pulmonary asthma , former smoker,  Sleep disordered breathing - may need sleep study per Dr. Caryl Comes.    Pulmonary exam normal breath sounds clear to auscultation       Cardiovascular Exercise Tolerance: Poor METS: 3 - Mets hypertension, Pt. on medications + DOE  Normal cardiovascular exam Rhythm:Regular Rate:Normal  ECHO 06/23/15  - Left ventricle: The cavity size was normal. There was mild concentric hypertrophy. Systolic function was normal. The estimated ejection fraction was in the range of 55% to 60%. Wall motion was normal; there were no regional wall motion abnormalities. Doppler parameters are consistent with abnormal left ventricular relaxation (grade 1 diastolic dysfunction). - Left atrium: The atrium was mildly dilated   Neuro/Psych PSYCHIATRIC DISORDERS Depression negative neurological ROS     GI/Hepatic GERD  Medicated and Controlled,  Endo/Other  Hypothyroidism Morbid obesity  Renal/GU      Musculoskeletal  (+) Arthritis , Osteoarthritis,    Abdominal   Peds  Hematology   Anesthesia Other Findings   Reproductive/Obstetrics                          BP Readings from Last 3 Encounters:  06/22/15 148/78  06/17/15 126/77  01/29/13 126/69   Lab Results  Component Value Date   WBC 5.0 06/17/2015   HGB 14.0 06/17/2015   HCT 41.9 06/17/2015   MCV 88.2 06/17/2015   PLT 352 06/17/2015     Chemistry      Component Value Date/Time   NA 141 06/17/2015 0833   K 4.5 06/17/2015 0833   CL 109 06/17/2015 0833   CO2 20* 06/17/2015 0833   BUN 20  06/17/2015 0833   CREATININE 0.87 06/17/2015 0833      Component Value Date/Time   CALCIUM 9.6 06/17/2015 0833   ALKPHOS 44 06/17/2015 0833   AST 36 06/17/2015 0833   ALT 53 06/17/2015 0833   BILITOT 0.6 06/17/2015 0833     EKG 06/17/15: Normal sinus rhythm Incomplete right bundle branch block     Anesthesia Physical Anesthesia Plan  ASA: III  Anesthesia Plan: Spinal   Post-op Pain Management:    Induction:   Airway Management Planned:   Additional Equipment:   Intra-op Plan:   Post-operative Plan: Extubation in OR  Informed Consent: I have reviewed the patients History and Physical, chart, labs and discussed the procedure including the risks, benefits and alternatives for the proposed anesthesia with the patient or authorized representative who has indicated his/her understanding and acceptance.   Dental advisory given  Plan Discussed with: CRNA  Anesthesia Plan Comments:         Anesthesia Quick Evaluation

## 2015-06-28 NOTE — Interval H&P Note (Signed)
History and Physical Interval Note:  06/28/2015 6:37 AM  Nancy Burch  has presented today for surgery, with the diagnosis of PRIMARY LOCALIZED OA RIGHT KNEE  The various methods of treatment have been discussed with the patient and family. After consideration of risks, benefits and other options for treatment, the patient has consented to  Procedure(s): TOTAL KNEE ARTHROPLASTY (Right) as a surgical intervention .  The patient's history has been reviewed, patient examined, no change in status, stable for surgery.  I have reviewed the patient's chart and labs.  Questions were answered to the patient's satisfaction.     Elsie Saas A

## 2015-06-28 NOTE — Op Note (Signed)
MRN:     [id number] DOB/AGE:    1960-07-10 / 55 y.o.       OPERATIVE REPORT    DATE OF PROCEDURE:  06/28/2015       PREOPERATIVE DIAGNOSIS:   PRIMARY LOCALIZED OA RIGHT KNEE      Estimated body mass index is 42.69 kg/(m^2) as calculated from the following:   Height as of this encounter: 5\' 4"  (1.626 m).   Weight as of this encounter: 112.855 kg (248 lb 12.8 oz).                                                        POSTOPERATIVE DIAGNOSIS:   SAME                                                                     PROCEDURE:  Procedure(s): TOTAL KNEE ARTHROPLASTY Using Depuy Attune RP implants #5 Femur, #5Tibia, 55mm  RP bearing, 29 Patella     SURGEON: Ashanna Heinsohn A    ASSISTANT:  Kirstin Shepperson PA-C   (Present and scrubbed throughout the case, critical for assistance with exposure, retraction, instrumentation, and closure.)         ANESTHESIA: Spinal with Adductor Nerve Block     TOURNIQUET TIME: [id number]   COMPLICATIONS:  None     SPECIMENS: None   INDICATIONS FOR PROCEDURE: The patient has  DJD RIGHT KNEE, varus deformities, XR shows bone on bone arthritis. Patient has failed all conservative measures including anti-inflammatory medicines, narcotics, attempts at  exercise and weight loss, cortisone injections and viscosupplementation.  Risks and benefits of surgery have been discussed, questions answered.   DESCRIPTION OF PROCEDURE: The patient identified by armband, received  right femoral nerve block and IV antibiotics, in the holding area at [facility]. Patient taken to the operating room, appropriate anesthetic  monitors were attached General endotracheal anesthesia induced with  the patient in supine position, Foley catheter was inserted. Tourniquet  applied high to the operative thigh. Lateral post and foot positioner  applied to the table, the lower extremity was then prepped and draped  in usual sterile fashion from the ankle to the tourniquet. Time-out  procedure was performed. The limb was wrapped with an Esmarch bandage and the tourniquet inflated to 365 mmHg. We began the operation by making the anterior midline incision starting at handbreadth above the patella going over the patella 1 cm medial to and  4 cm distal to the tibial tubercle. Small bleeders in the skin and the  subcutaneous tissue identified and cauterized. Transverse retinaculum was incised and reflected medially and a medial parapatellar arthrotomy was accomplished. the patella was everted and theprepatellar fat pad resected. The superficial medial collateral  ligament was then elevated from anterior to posterior along the proximal  flare of the tibia and anterior half of the menisci resected. The knee was hyperflexed exposing bone on bone arthritis. Peripheral and notch osteophytes as well as the cruciate ligaments were then resected. We continued to  work our way around posteriorly along the proximal tibia, and externally  rotated the tibia subluxing  it out from underneath the femur. A McHale  retractor was placed through the notch and a lateral Hohmann retractor  placed, and we then drilled through the proximal tibia in line with the  axis of the tibia followed by an intramedullary guide rod and 2-degree  posterior slope cutting guide. The tibial cutting guide was pinned into place  allowing resection of 4 mm of bone medially and about 6 mm of bone  laterally because of her varus deformity. Satisfied with the tibial resection, we then  entered the distal femur 2 mm anterior to the PCL origin with the  intramedullary guide rod and applied the distal femoral cutting guide  set at 6mm, with 5 degrees of valgus. This was pinned along the  epicondylar axis. At this point, the distal femoral cut was accomplished without difficulty. We then sized for a #5 femoral component and pinned the guide in 3 degrees of external rotation.The chamfer cutting guide was pinned into place. The  anterior, posterior, and chamfer cuts were accomplished without difficulty followed by  the  RP box cutting guide and the box cut. We also removed posterior osteophytes from the posterior femoral condyles. At this  time, the knee was brought into full extension. We checked our  extension and flexion gaps and found them symmetric at 2mm.  The patella thickness measured at 23 mm. We set the cutting guide at 15 and removed the posterior 9.5-10 mm  of the patella sized for 29 button and drilled the lollipop. The knee  was then once again hyperflexed exposing the proximal tibia. We sized for a #5 tibial base plate, applied the smokestack and the conical reamer followed by the the Delta fin keel punch. We then hammered into place the  RP trial femoral component, inserted a 1 trial bearing, trial patellar button, and took the knee through range of motion from 0-130 degrees. No thumb pressure was required for patellar  tracking. At this point, all trial components were removed, a double batch of DePuy HV cement  was mixed and applied to all bony metallic mating surfaces except for the posterior condyles of the femur itself. In order, we  hammered into place the tibial tray and removed excess cement, the femoral component and removed excess cement, a 22mm  RP bearing  was inserted, and the knee brought to full extension with compression.  The patellar button was clamped into place, and excess cement  removed. While the cement cured the wound was irrigated out with normal saline solution pulse lavage.. Ligament stability and patellar tracking were checked and found to be excellent.. The parapatellar arthrotomy was closed with  #1 Vicryl suture. The subcutaneous tissue with 0 and 2-0 undyed  Vicryl suture, and 4-0 Monocryl.. A dressing of Aquaseal,  4 x 4, dressing sponges, Webril, and Ace wrap applied. Needle and sponge count were correct times 2.The patient awakened, extubated, and taken to recovery room without  difficulty. Vascular status was normal, pulses 2+ and symmetric.   Otha Monical A 06/28/2015, 2:18 PM

## 2015-06-28 NOTE — Anesthesia Postprocedure Evaluation (Signed)
Anesthesia Post Note  Patient: Nancy Burch  Procedure(s) Performed: Procedure(s) (LRB): TOTAL KNEE ARTHROPLASTY (Right)  Patient location during evaluation: PACU Anesthesia Type: Spinal and MAC Level of consciousness: awake and alert Pain management: pain level controlled Vital Signs Assessment: post-procedure vital signs reviewed and stable Respiratory status: spontaneous breathing and respiratory function stable Cardiovascular status: blood pressure returned to baseline and stable Postop Assessment: spinal receding Anesthetic complications: no    Last Vitals:  Filed Vitals:   06/28/15 1530 06/28/15 1545  BP: 106/55 102/52  Pulse: 73 75  Temp:    Resp: 19 16    Last Pain:  Filed Vitals:   06/28/15 1632  PainSc: [place]

## 2015-06-28 NOTE — Transfer of Care (Signed)
Immediate Anesthesia Transfer of Care Note  Patient: Nancy Burch  Procedure(s) Performed: Procedure(s): TOTAL KNEE ARTHROPLASTY (Right)  Patient Location: PACU  Anesthesia Type:MAC combined with regional for post-op pain  Level of Consciousness: awake, alert , oriented and patient cooperative  Airway & Oxygen Therapy: Patient Spontanous Breathing and Patient connected to nasal cannula oxygen  Post-op Assessment: Report given to RN and Post -op Vital signs reviewed and stable  Post vital signs: Reviewed and stable  Last Vitals:  Filed Vitals:   06/28/15 1134 06/28/15 1135  BP:    Pulse: 76 76  Temp:    Resp: 16 15    Last Pain:  Filed Vitals:   06/28/15 1135  PainSc: 4       Patients Stated Pain Goal: 4 ([id number])  Complications: No apparent anesthesia complications

## 2015-06-28 NOTE — Anesthesia Procedure Notes (Addendum)
Spinal Patient location during procedure: OR Staffing Anesthesiologist: JUDD, MARY Performed by: anesthesiologist  Preanesthetic Checklist Completed: patient identified, site marked, surgical consent, pre-op evaluation, timeout performed, IV checked, risks and benefits discussed and monitors and equipment checked Spinal Block Patient position: sitting Prep: ChloraPrep Patient monitoring: continuous pulse ox, blood pressure and heart rate Approach: midline Location: L3-4 Injection technique: single-shot Needle Needle type: Sprotte  Needle gauge: 24 G Needle length: 9 cm Additional Notes Functioning IV was confirmed and monitors were applied. Sterile prep and drape, including hand hygiene, mask and sterile gloves were used. The patient was positioned and the spine was prepped. The skin was anesthetized with lidocaine.  Free flow of clear CSF was obtained prior to injecting local anesthetic into the CSF.  The spinal needle aspirated freely following injection.  The needle was carefully withdrawn.  The patient tolerated the procedure well. Consent was obtained prior to procedure with all questions answered and concerns addressed. Risks including but not limited to bleeding, infection, nerve damage, paralysis, failed block, inadequate analgesia, allergic reaction, high spinal, itching and headache were discussed and the patient wished to proceed.   Lauretta Grill, MD    Procedure Name: [facility] Date/Time: 06/28/2015 12:11 PM Performed by: Layla Maw Pre-anesthesia Checklist: Patient identified, Patient being monitored, Timeout performed, Emergency Drugs available and Suction available Patient Re-evaluated:Patient Re-evaluated prior to inductionOxygen Delivery Method: Simple face mask Preoxygenation: Pre-oxygenation with 100% oxygen Number of attempts: 1 Placement Confirmation: positive ETCO2 Dental Injury: Teeth and Oropharynx as per pre-operative assessment

## 2015-06-29 ENCOUNTER — Encounter (HOSPITAL_COMMUNITY): Payer: Self-pay | Admitting: Orthopedic Surgery

## 2015-06-29 LAB — BASIC METABOLIC PANEL
ANION GAP: 10 (ref 5–15)
BUN: 21 mg/dL — ABNORMAL HIGH (ref 6–20)
CO2: 23 mmol/L (ref 22–32)
Calcium: 9.2 mg/dL (ref 8.9–10.3)
Chloride: 102 mmol/L (ref 101–111)
Creatinine, Ser: 0.98 mg/dL (ref 0.44–1.00)
GFR calc non Af Amer: >60 mL/min (ref >60)
GLUCOSE: 169 mg/dL — AB (ref 65–99)
POTASSIUM: 4.1 mmol/L (ref 3.5–5.1)
Sodium: 135 mmol/L (ref 135–145)

## 2015-06-29 LAB — CBC
HEMATOCRIT: 33.1 % — AB (ref 36.0–46.0)
HEMOGLOBIN: 11.2 g/dL — AB (ref 12.0–15.0)
MCH: 30.2 pg (ref 26.0–34.0)
MCHC: 33.8 g/dL (ref 30.0–36.0)
MCV: 89.2 fL (ref 78.0–100.0)
Platelets: 327 10*3/uL (ref 150–400)
RBC: 3.71 MIL/uL — AB (ref 3.87–5.11)
RDW: 12.4 % (ref 11.5–15.5)
WBC: 10.5 10*3/uL (ref 4.0–10.5)

## 2015-06-29 MED ORDER — DEXAMETHASONE SODIUM PHOSPHATE 10 MG/ML IJ SOLN
INTRAMUSCULAR | Status: AC
  Filled 2015-06-29: qty 1

## 2015-06-29 MED ORDER — PHENYLEPHRINE 40 MCG/ML (10ML) SYRINGE FOR IV PUSH (FOR BLOOD PRESSURE SUPPORT)
PREFILLED_SYRINGE | INTRAVENOUS | Status: AC
  Filled 2015-06-29: qty 10

## 2015-06-29 MED ORDER — SUCCINYLCHOLINE CHLORIDE 200 MG/10ML IV SOSY
PREFILLED_SYRINGE | INTRAVENOUS | Status: AC
  Filled 2015-06-29: qty 10

## 2015-06-29 MED ORDER — LIDOCAINE 2% (20 MG/ML) 5 ML SYRINGE
INTRAMUSCULAR | Status: AC
  Filled 2015-06-29: qty 5

## 2015-06-29 MED ORDER — GLYCOPYRROLATE 0.2 MG/ML IV SOSY
PREFILLED_SYRINGE | INTRAVENOUS | Status: AC
  Filled 2015-06-29: qty 3

## 2015-06-29 NOTE — Progress Notes (Signed)
Physical Therapy Treatment Patient Details Name: Nancy Burch MRN: [id number] DOB: [birth year] Today's Date: 06/29/2015    History of Present Illness Pt is a 55 y.o. female now s/p Rt TKA. PMH: asthma, depression.     PT Comments    Pt making steady progress with mobility during PT sessions. Anticipate pt will D/C to home with her spouse to assist. PT to continue to follow and advance mobility at tolerated. Patient denies any questions or concerns following session.   Follow Up Recommendations  Home health PT;Supervision/Assistance - 24 hour     Equipment Recommendations  None recommended by PT    Recommendations for Other Services       Precautions / Restrictions Precautions Precautions: Knee;Fall Precaution Booklet Issued: Yes (comment) Precaution Comments: educated on knee precautions Restrictions Weight Bearing Restrictions: Yes RLE Weight Bearing: Weight bearing as tolerated    Mobility  Bed Mobility Overal bed mobility: Needs Assistance Bed Mobility: Sit to Supine     Supine to sit: Modified independent (Device/Increase time) (with rail)        Transfers Overall transfer level: Needs assistance Equipment used: Rolling walker (2 wheeled) Transfers: Sit to/from Stand Sit to Stand: Supervision         General transfer comment: pt bending both LEs as able to assist with transfer  Ambulation/Gait Ambulation/Gait assistance: Supervision Ambulation Distance (Feet): 400 Feet Assistive device: Rolling walker (2 wheeled) Gait Pattern/deviations: Step-through pattern Gait velocity: decreased   General Gait Details: steady pattern, cues for knee flexion with swing phase.    Stairs Stairs: Yes Stairs assistance: Min guard Stair Management: One rail Right;Step to pattern;Forwards Number of Stairs: 5 General stair comments: steady pattern, cues for sequence  Wheelchair Mobility    Modified Rankin (Stroke Patients Only)       Balance Overall balance  assessment: Needs assistance Sitting-balance support: No upper extremity supported Sitting balance-Leahy Scale: Good     Standing balance support: During functional activity Standing balance-Leahy Scale: Fair Standing balance comment: rw with ambulation                    Cognition Arousal/Alertness: Awake/alert Behavior During Therapy: WFL for tasks assessed/performed Overall Cognitive Status: Within Functional Limits for tasks assessed                      Exercises Total Joint Exercises Ankle Circles/Pumps: AROM;Both;10 reps Quad Sets: Strengthening;Right;10 reps Short Arc Quad: Strengthening;Right;10 reps Heel Slides: AAROM;Right;10 reps Hip ABduction/ADduction: Strengthening;Right;10 reps Straight Leg Raises: Strengthening;Right;10 reps (min assist x7 reps) Other Exercises Other Exercises: Pt performed 5 straight leg raises with Rt LE.     General Comments        Pertinent Vitals/Pain Pain Assessment: 0-10 Pain Score: 4  Pain Location: Rt knee Pain Descriptors / Indicators: Aching Pain Intervention(s): Monitored during session;Limited activity within patient's tolerance    Home Living   Prior Function           PT Goals (current goals can now be found in the care plan section) Acute Rehab PT Goals Patient Stated Goal: go back home PT Goal Formulation: With patient Time For Goal Achievement: 07/13/15 Potential to Achieve Goals: Good Progress towards PT goals: Progressing toward goals    Frequency  7X/week    PT Plan Current plan remains appropriate    Co-evaluation             End of Session Equipment Utilized During Treatment: Gait belt Activity Tolerance: Patient tolerated  treatment well Patient left: in chair;with call bell/phone within reach (in knee extension)     Time: [id number] PT Time Calculation (min) (ACUTE ONLY): 24 min  Charges:  $Gait Training: 8-22 mins $Therapeutic Exercise: 8-22 mins                    G  Codes:      Cassell Clement, PT, CSCS Pager [phone] Office [phone]  06/29/2015, 4:13 PM

## 2015-06-29 NOTE — Evaluation (Signed)
Physical Therapy Evaluation Patient Details Name: Nancy Burch MRN: [id number] DOB: [birth year] Today's Date: 06/29/2015   History of Present Illness  Pt is a 55 y.o. female now s/p Rt TKA. PMH: asthma, depression.   Clinical Impression  Pt is s/p TKA resulting in the deficits listed below (see PT Problem List).  Pt will benefit from skilled PT to increase their independence and safety with mobility to allow D/C to home with family support. Pt able to ambulate 200 feet during initial PT session. Will progress as tolerated.       Follow Up Recommendations Home health PT;Supervision/Assistance - 24 hour    Equipment Recommendations  None recommended by PT (pt reports having rw arranged for home)    Recommendations for Other Services       Precautions / Restrictions Precautions Precautions: Knee;Fall Precaution Booklet Issued: Yes (comment) Precaution Comments: HEP provided and knee extension precautions reviewed. Required Braces or Orthoses: Knee Immobilizer - Right Restrictions Weight Bearing Restrictions: Yes RLE Weight Bearing: Weight bearing as tolerated      Mobility  Bed Mobility Overal bed mobility: Needs Assistance Bed Mobility: Sit to Supine       Sit to supine: Supervision   General bed mobility comments: supervision for safety  Transfers Overall transfer level: Needs assistance Equipment used: Rolling walker (2 wheeled) Transfers: Sit to/from Stand Sit to Stand: Supervision         General transfer comment: safe technique demonstrated  Ambulation/Gait Ambulation/Gait assistance: Supervision Ambulation Distance (Feet): 200 Feet Assistive device: Rolling walker (2 wheeled) Gait Pattern/deviations: Step-through pattern;Decreased weight shift to right Gait velocity: decreased   General Gait Details: variable stride lengths bilaterally, good weight shift to right for POD 1.   Stairs            Wheelchair Mobility    Modified Rankin (Stroke  Patients Only)       Balance Overall balance assessment: Needs assistance Sitting-balance support: No upper extremity supported Sitting balance-Leahy Scale: Good     Standing balance support: During functional activity Standing balance-Leahy Scale: Fair Standing balance comment: using rw for support.                              Pertinent Vitals/Pain Pain Assessment: 0-10 Pain Score: 4  Pain Location: Rt knee  Pain Descriptors / Indicators: Sore;Aching Pain Intervention(s): Limited activity within patient's tolerance;Monitored during session    Home Living Family/patient expects to be discharged to:: Private residence Living Arrangements: Spouse/significant other Available Help at Discharge: Family;Available 24 hours/day Type of Home: House Home Access: Stairs to enter Entrance Stairs-Rails: Right;Left;Can reach both Entrance Stairs-Number of Steps: 4 Home Layout: Two level;Able to live on main level with bedroom/bathroom Home Equipment: Gilford Rile - 2 wheels      Prior Function Level of Independence: Independent               Hand Dominance        Extremity/Trunk Assessment   Upper Extremity Assessment: Defer to OT evaluation           Lower Extremity Assessment: RLE deficits/detail RLE Deficits / Details: able to perform SLR independently       Communication   Communication: No difficulties  Cognition Arousal/Alertness: Awake/alert Behavior During Therapy: WFL for tasks assessed/performed Overall Cognitive Status: Within Functional Limits for tasks assessed  General Comments      Exercises Total Joint Exercises Ankle Circles/Pumps: AROM;Both;10 reps Quad Sets: Strengthening;Right;10 reps Heel Slides: AAROM;Right;10 reps Straight Leg Raises: Strengthening;Right;10 reps (min assist x7 reps) Goniometric ROM: approx. 80 degrees flexion      Assessment/Plan    PT Assessment Patient needs continued PT  services  PT Diagnosis Difficulty walking   PT Problem List Decreased range of motion;Decreased strength;Decreased activity tolerance;Decreased balance;Decreased mobility  PT Treatment Interventions DME instruction;Gait training;Functional mobility training;Therapeutic activities;Stair training;Therapeutic exercise;Balance training;Patient/family education   PT Goals (Current goals can be found in the Care Plan section) Acute Rehab PT Goals Patient Stated Goal: go back home PT Goal Formulation: With patient Time For Goal Achievement: 07/13/15 Potential to Achieve Goals: Good    Frequency 7X/week   Barriers to discharge        Co-evaluation               End of Session Equipment Utilized During Treatment: Gait belt;Other (comment) (pt declined KI, reports not using with PA-C this morning. ) Activity Tolerance: Patient tolerated treatment well Patient left: in bed;with call bell/phone within reach;with SCD's reapplied;in CPM Nurse Communication: Mobility status;Weight bearing status         Time: [id number] PT Time Calculation (min) (ACUTE ONLY): 30 min   Charges:   PT Evaluation $PT Eval Moderate Complexity: 1 Procedure PT Treatments $Gait Training: 8-22 mins   PT G Codes:        Cassell Clement, PT, CSCS Pager [phone] Office [phone]  06/29/2015, 11:05 AM

## 2015-06-29 NOTE — Evaluation (Signed)
Occupational Therapy Evaluation Patient Details Name: Nancy Burch MRN: [id number] DOB: 1961/02/15 Today's Date: 06/29/2015    History of Present Illness Pt is a 55 y.o. female now s/p Rt TKA. PMH: asthma, depression, arthiritis, hypothyroidism, GERD, Hashimoto's thyroiditis and pt obese.     Clinical Impression   Pt s/p above. Education provided in session and OT signing off.     Follow Up Recommendations  No OT follow up;Supervision - Intermittent    Equipment Recommendations  3 in 1 bedside comode (reports she is getting a 3 in 1 )    Recommendations for Other Services       Precautions / Restrictions Precautions Precautions: Knee;Fall Precaution Booklet Issued: No Precaution Comments: educated on knee precautions Required Braces or Orthoses: Knee Immobilizer - Right (not used in session-pt able to do 5 straight leg raises) Restrictions Weight Bearing Restrictions: Yes RLE Weight Bearing: Weight bearing as tolerated      Mobility Bed Mobility Overal bed mobility: Modified Independent      Transfers Overall transfer level: Needs assistance Transfers: Sit to/from Stand Sit to Stand: Supervision         General transfer comment: RW in front of pt; also set up for RW    Balance Took a few steps without RW-Min guard. Noted unsteadiness in session when standing.                     ADL Overall ADL's : Needs assistance/impaired                     Lower Body Dressing: Minimal assistance;Sit to/from stand   Toilet Transfer: Min guard;Ambulation;RW;Comfort height toilet;Grab bars   Toileting- Clothing Manipulation and Hygiene: Supervision/safety;Sit to/from stand   Tub/ Shower Transfer: Walk-in shower;Min guard;Ambulation;Rolling walker   Functional mobility during ADLs: Min guard;Rolling walker General ADL Comments: Educated on LB dressing technique. Discussed AE. Educated on safety such as safe footwear, use of bag on walker, and  recommended someone be with her for shower transfer. Educated on benefit of reaching down to manage right sock as it allows knee to bend.      Vision     Perception     Praxis      Pertinent Vitals/Pain Pain Assessment: 0-10 Pain Score:  (4-5) Pain Location: Rt LE Pain Descriptors / Indicators: Constant;Aching;Throbbing Pain Intervention(s): Monitored during session     Hand Dominance     Extremity/Trunk Assessment Upper Extremity Assessment Upper Extremity Assessment: Overall WFL for tasks assessed   Lower Extremity Assessment Lower Extremity Assessment: Defer to PT evaluation RLE Deficits / Details: able to perform straight leg raise independently       Communication Communication Communication: No difficulties   Cognition Arousal/Alertness: Awake/alert Behavior During Therapy: WFL for tasks assessed/performed Overall Cognitive Status: Within Functional Limits for tasks assessed                     General Comments       Exercises  Other Exercises Other Exercises: Pt performed 5 straight leg raises with Rt LE.    Shoulder Instructions      Home Living Family/patient expects to be discharged to:: Private residence Living Arrangements: Spouse/significant other Available Help at Discharge: Family;Available 24 hours/day Type of Home: House Home Access: Stairs to enter [employer] of Steps: 4 Entrance Stairs-Rails: Right;Left;Can reach both Home Layout: Two level;Able to live on main level with bedroom/bathroom     Bathroom Shower/Tub: Walk-in shower;Door  Bathroom Toilet: Standard     Home Equipment: Environmental consultant - 2 wheels;Shower seat - built in          Prior Functioning/Environment Level of Independence: Independent             OT Diagnosis: Acute pain   OT Problem List: Decreased strength;Decreased range of motion;Decreased activity tolerance;Impaired balance (sitting and/or standing);Obesity;Pain   OT  Treatment/Interventions:      OT Goals(Current goals can be found in the care plan section)   OT Frequency:     Barriers to D/C:            Co-evaluation              End of Session Equipment Utilized During Treatment: Rolling walker CPM Right Knee CPM Right Knee: Off   Activity Tolerance: Patient tolerated treatment well Patient left: in bed;with call bell/phone within reach   Time: 1313-1333 OT Time Calculation (min): 20 min Charges:  OT General Charges $OT Visit: 1 Procedure OT Evaluation $OT Eval Moderate Complexity: 1 Procedure G-CodesBenito Mccreedy OTR/L [id number] 06/29/2015, 3:00 PM

## 2015-06-29 NOTE — Progress Notes (Signed)
Subjective: 1 Day Post-Op Procedure(s) (LRB): TOTAL KNEE ARTHROPLASTY (Right) Patient reports pain as 5 on 0-10 scale.    Objective: Vital signs in last 24 hours: Temp:  [97.5 F (36.4 C)-99.1 F (37.3 C)] 98.1 F (36.7 C) (05/02 0500) Pulse Rate:  [72-90] 88 (05/02 0500) Resp:  [10-19] 18 (05/02 0500) BP: (99-126)/(47-65) 119/61 mmHg (05/02 0500) SpO2:  [91 %-100 %] 91 % (05/02 0500)  Intake/Output from previous day: 05/01 0701 - 05/02 0700 In: 2240 [P.O.:240; I.V.:2000] Out: 2420 [Urine:2400; Blood:20] Intake/Output this shift: Total I/O In: 480 [P.O.:480] Out: -    Recent Labs  06/28/15 1757 06/29/15 0509  HGB 13.0 11.2*    Recent Labs  06/28/15 1757 06/29/15 0509  WBC 7.1 10.5  RBC 4.38 3.71*  HCT 38.5 33.1*  PLT 331 327    Recent Labs  06/28/15 1757 06/29/15 0509  NA  --  135  K  --  4.1  CL  --  102  CO2  --  23  BUN  --  21*  CREATININE 1.04* 0.98  GLUCOSE  --  169*  CALCIUM  --  9.2   No results for input(s): LABPT, INR in the last 72 hours.  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  Britt Petroni J 06/29/2015, 10:01 AM

## 2015-06-30 ENCOUNTER — Encounter (HOSPITAL_COMMUNITY): Payer: Self-pay | Admitting: General Practice

## 2015-06-30 LAB — CBC
HCT: 31.9 % — ABNORMAL LOW (ref 36.0–46.0)
Hemoglobin: 10.7 g/dL — ABNORMAL LOW (ref 12.0–15.0)
MCH: 29.6 pg (ref 26.0–34.0)
MCHC: 33.5 g/dL (ref 30.0–36.0)
MCV: 88.4 fL (ref 78.0–100.0)
PLATELETS: 369 10*3/uL (ref 150–400)
RBC: 3.61 MIL/uL — AB (ref 3.87–5.11)
RDW: 12.5 % (ref 11.5–15.5)
WBC: 8.3 10*3/uL (ref 4.0–10.5)

## 2015-06-30 LAB — BASIC METABOLIC PANEL
Anion gap: 10 (ref 5–15)
BUN: 21 mg/dL — AB (ref 6–20)
CO2: 27 mmol/L (ref 22–32)
CREATININE: 0.99 mg/dL (ref 0.44–1.00)
Calcium: 9.1 mg/dL (ref 8.9–10.3)
Chloride: 100 mmol/L — ABNORMAL LOW (ref 101–111)
Glucose, Bld: 163 mg/dL — ABNORMAL HIGH (ref 65–99)
POTASSIUM: 5 mmol/L (ref 3.5–5.1)
SODIUM: 137 mmol/L (ref 135–145)

## 2015-06-30 MED ORDER — OXYCODONE HCL ER 20 MG PO T12A: 20.0000 mg | EXTENDED_RELEASE_TABLET | Freq: Two times a day (BID) | ORAL | Status: DC

## 2015-06-30 MED ORDER — ENOXAPARIN SODIUM 30 MG/0.3ML ~~LOC~~ SOLN
30.0000 mg | Freq: Two times a day (BID) | SUBCUTANEOUS | Status: DC
Start: 2015-06-30 — End: 2015-12-27

## 2015-06-30 MED ORDER — CELECOXIB 200 MG PO CAPS: ORAL_CAPSULE | ORAL | Status: DC

## 2015-06-30 MED ORDER — POLYETHYLENE GLYCOL 3350 17 G PO PACK: PACK | ORAL | Status: DC

## 2015-06-30 MED ORDER — DOCUSATE SODIUM 100 MG PO CAPS: ORAL_CAPSULE | ORAL | Status: DC

## 2015-06-30 MED ORDER — OXYCODONE HCL 5 MG PO TABS: ORAL_TABLET | ORAL | Status: DC

## 2015-06-30 NOTE — Discharge Summary (Signed)
Patient ID: Nancy Burch MRN: [id number] DOB/AGE: [birth year] 55 y.o.  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 Asthma   . Hypothyroidism   . Depression   . Arthritis   . GERD (gastroesophageal reflux disease)   . Primary localized osteoarthritis of right knee 06/16/2015  . Pure hypercholesterolemia 06/16/2015  . Hashimoto's thyroiditis 06/16/2015  . 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 female who was admitted 06/28/2015 for operative treatment ofPrimary localized osteoarthritis of right knee. Patient has severe unremitting pain that affects sleep, daily activities, and work/hobbies. After pre-op clearance the patient was taken to the operating room on 06/28/2015 and underwent  Procedure(s): TOTAL KNEE ARTHROPLASTY.    Patient was given perioperative antibiotics: Anti-infectives    Start     Dose/Rate Route Frequency Ordered Stop   06/28/15 2300  vancomycin (VANCOCIN) IVPB 1000 mg/200 mL premix     1,000 mg 200 mL/hr over 60 Minutes Intravenous Every 12 hours 06/28/15 1710 06/29/15 0050   06/28/15 1715  doxycycline (VIBRA-TABS) tablet 100 mg     100 mg Oral Daily 06/28/15 1710     06/28/15 1100  vancomycin (VANCOCIN) 1,500 mg in sodium chloride 0.9 % 500 mL IVPB     1,500 mg 250 mL/hr over 120 Minutes Intravenous To Short Stay 06/27/15 1509  06/28/15 1230       Patient was given sequential compression devices, early ambulation, and chemoprophylaxis to prevent DVT.  Patient benefited maximally from hospital stay and there were no complications.    Recent vital signs: Patient Vitals for the past 24 hrs:  BP Temp Temp src Pulse Resp SpO2  06/30/15 0600 132/65 mmHg 97.9 F (36.6 C) - 85 18 94 %  06/29/15 2217 122/68 mmHg 98.1 F (36.7 C) Oral 85 18 94 %  06/29/15 2117 - - - - - 94 %  06/29/15 1300 (!) 130/59 mmHg 98.2 F (36.8 C) - 93 18 93 %     Recent laboratory studies:  Recent Labs  06/29/15 0509 06/30/15 0344  WBC 10.5 8.3  HGB 11.2* 10.7*  HCT 33.1* 31.9*  PLT 327 369  NA 135 137  K 4.1 5.0  CL 102 100*  CO2 23 27  BUN 21* 21*  CREATININE 0.98 0.99  GLUCOSE 169* 163*  CALCIUM 9.2 9.1     Discharge Medications:     Medication List    STOP taking these medications        meloxicam 15 MG tablet  Commonly known as:  MOBIC      TAKE these medications        ADVAIR DISKUS 100-50 MCG/DOSE Aepb  Generic drug:  Fluticasone-Salmeterol  Inhale 1 puff into the lungs 2 (two) times daily as needed.     albuterol 108 (90 Base) MCG/ACT inhaler  Commonly known as:  PROVENTIL HFA;VENTOLIN HFA  Inhale 2 puffs into the lungs  every 6 (six) hours as needed for wheezing.     celecoxib 200 MG capsule  Commonly known as:  CELEBREX  1 tab po q day with food for pain and  swelling     docusate sodium 100 MG capsule  Commonly known as:  COLACE  1 tab 2 times a day while on narcotics.  STOOL SOFTENER     doxycycline 100 MG EC tablet  Commonly known as:  DORYX  Take 100 mg by mouth daily. with food     DULoxetine 60 MG capsule  Commonly known as:  CYMBALTA  Take 1 capsule by mouth daily.     enoxaparin 30 MG/0.3ML injection  Commonly known as:  LOVENOX  Inject 0.3 mLs (30 mg total) into the skin every 12 (twelve) hours.     famotidine 10 MG tablet  Commonly known as:  PEPCID  Take 10 mg by mouth 2  (two) times daily.     fenofibrate 160 MG tablet  Take 1 tablet by mouth daily.     levothyroxine 75 MCG tablet  Commonly known as:  SYNTHROID, LEVOTHROID  Take 75 mcg by mouth daily before breakfast.     loratadine 10 MG tablet  Commonly known as:  CLARITIN  Take 10 mg by mouth daily.     multivitamin with minerals tablet  Take 1 tablet by mouth daily.     oxyCODONE 5 MG immediate release tablet  Commonly known as:  Oxy IR/ROXICODONE  1-2 tablets every 4-6 hrs as needed for breakthrough pain     oxyCODONE 20 mg 12 hr tablet  Commonly known as:  OXYCONTIN  Take 1 tablet (20 mg total) by mouth every 12 (twelve) hours.     polyethylene glycol packet  Commonly known as:  MIRALAX / GLYCOLAX  17grams in 16 oz of water twice a day until bowel movement.  LAXITIVE.  Restart if two days since last bowel movement     spironolactone 50 MG tablet  Commonly known as:  ALDACTONE  Take 50 mg by mouth daily.     tacrolimus 0.1 % ointment  Commonly known as:  PROTOPIC  Apply 1 application topically at bedtime.     Vitamin D3 5000 units Tabs  Take 1 tablet by mouth daily.        Diagnostic Studies: No results found.  Disposition: Final discharge disposition not confirmed      Discharge Instructions    CPM    Complete by:  As directed   Continuous passive motion machine (CPM):      Use the CPM from 0 to 90 for 6 hours per day.       You may break it up into 2 or 3 sessions per day.      Use CPM for 2 weeks or until you are told to stop.     Call MD / Call 911    Complete by:  As directed   If you experience chest pain or shortness of breath, CALL 911 and be transported to the hospital emergency room.  If you develope a fever above 101 F, pus (white drainage) or increased drainage or redness at the wound, or calf pain, call your surgeon's office.     Change dressing    Complete by:  As directed   Change the gauze dressing daily with sterile 4 x 4 inch gauze and apply TED hose.   DO NOT REMOVE BANDAGE OVER SURGICAL INCISION.  [place] WHOLE LEG INCLUDING OVER THE  WATERPROOF BANDAGE WITH SOAP AND WATER EVERY DAY.     Constipation Prevention    Complete by:  As directed   Drink plenty of fluids.  Prune juice may be helpful.  You may use a stool softener, such as Colace (over the counter) 100 mg twice a day.  Use MiraLax (over the counter) for constipation as needed.     Diet - low sodium heart healthy    Complete by:  As directed      Discharge instructions    Complete by:  As directed   INSTRUCTIONS AFTER JOINT REPLACEMENT   Remove items at home which could result in a fall. This includes throw rugs or furniture in walking pathways ICE to the affected joint every three hours while awake for 30 minutes at a time, for at least the first 3-5 days, and then as needed for pain and swelling.  Continue to use ice for pain and swelling. You may notice swelling that will progress down to the foot and ankle.  This is normal after surgery.  Elevate your leg when you are not up walking on it.   Continue to use the breathing machine you got in the hospital (incentive spirometer) which will help keep your temperature down.  It is common for your temperature to cycle up and down following surgery, especially at night when you are not up moving around and exerting yourself.  The breathing machine keeps your lungs expanded and your temperature down.   DIET:  As you were doing prior to hospitalization, we recommend a well-balanced diet.  DRESSING / WOUND CARE / SHOWERING  Keep the surgical dressing until follow up.  The dressing is water proof, so you can shower without any extra covering.  IF THE DRESSING FALLS OFF or the wound gets wet inside, change the dressing with sterile gauze.  Please use good hand washing techniques before changing the dressing.  Do not use any lotions or creams on the incision until instructed by your surgeon.    ACTIVITY  Increase activity slowly as tolerated, but  follow the weight bearing instructions below.   No driving for 6 weeks or until further direction given by your physician.  You cannot drive while taking narcotics.  No lifting or carrying greater than 10 lbs. until further directed by your surgeon. Avoid periods of inactivity such as sitting longer than an hour when not asleep. This helps prevent blood clots.  You may return to work once you are authorized by your doctor.     WEIGHT BEARING   Weight bearing as tolerated with assist device (walker, cane, etc) as directed, use it as long as suggested by your surgeon or therapist, typically at least 2-3 weeks.   EXERCISES  Results after joint replacement surgery are often greatly improved when you follow the exercise, range of motion and muscle strengthening exercises prescribed by your doctor. Safety measures are also important to protect the joint from further injury. Any time any of these exercises cause you to have increased pain or swelling, decrease what you are doing until you are comfortable again and then slowly increase them. If you have problems or questions, call your caregiver or physical therapist for advice.   Rehabilitation is important following a joint replacement. After just a few days of immobilization, the muscles of the leg can become weakened and shrink (atrophy).  These exercises are designed to build up the tone and strength of the thigh and leg muscles and to improve motion.  Often times heat used for twenty to thirty minutes before working out will loosen up your tissues and help with improving the range of motion but do not use heat for the first two weeks following surgery (sometimes heat can increase post-operative swelling).   These exercises can be done on a training (exercise) mat, on the floor, on a table or on a bed. Use whatever works the best and is most comfortable for you.    Use music or television while you are exercising so that the exercises are a pleasant  break in your day. This will make your life better with the exercises acting as a break in your routine that you can look forward to.   Perform all exercises about fifteen times, three times per day or as directed.  You should exercise both the operative leg and the other leg as well.   Exercises include:  Quad Sets - Tighten up the muscle on the front of the thigh (Quad) and hold for 5-10 seconds.   Straight Leg Raises - With your knee straight (if you were given a brace, keep it on), lift the leg to 60 degrees, hold for 3 seconds, and slowly lower the leg.  Perform this exercise against resistance later as your leg gets stronger.  Leg Slides: Lying on your back, slowly slide your foot toward your buttocks, bending your knee up off the floor (only go as far as is comfortable). Then slowly slide your foot back down until your leg is flat on the floor again.  Angel Wings: Lying on your back spread your legs to the side as far apart as you can without causing discomfort.  Hamstring Strength:  Lying on your back, push your heel against the floor with your leg straight by tightening up the muscles of your buttocks.  Repeat, but this time bend your knee to a comfortable angle, and push your heel against the floor.  You may put a pillow under the heel to make it more comfortable if necessary.   A rehabilitation program following joint replacement surgery can speed recovery and prevent re-injury in the future due to weakened muscles. Contact your doctor or a physical therapist for more information on knee rehabilitation.    CONSTIPATION  Constipation is defined medically as fewer than three stools per week and severe constipation as less than one stool per week.  Even if you have a regular bowel pattern at home, your normal regimen is likely to be disrupted due to multiple reasons following surgery.  Combination of anesthesia, postoperative narcotics, change in appetite and fluid intake all can affect your  bowels.   YOU MUST use at least one of the following options; they are listed in order of increasing strength to get the job done.  They are all available over the counter, and you may need to use some, POSSIBLY even all of these options:    Drink plenty of fluids (prune juice may be helpful) and high fiber foods Colace 100 mg by mouth twice a day  Senokot for constipation as directed and as needed Dulcolax (bisacodyl), take with full glass of water  Miralax (polyethylene glycol) once or twice a day as needed.  If you have tried all these things and are unable to have a bowel movement in the first 3-4 days after surgery call either your surgeon or your primary doctor.    If you experience loose stools or diarrhea, hold the medications until you stool forms back up.  If your symptoms do not get better within 1 week or if they get worse, check with your doctor.  If you experience "the worst abdominal pain ever" or develop nausea or vomiting, please contact the office immediately for further recommendations for treatment.   ITCHING:  If you experience itching with your medications, try taking only a single pain pill, or even half a pain pill at a time.  You can also use Benadryl over the counter for itching or also to help with sleep.   TED HOSE STOCKINGS:  Use stockings on both legs until for at least 2 weeks or as directed by physician office. They may be removed at night for sleeping.  MEDICATIONS:  See your medication summary on the "After Visit Summary" that nursing will review with you.  You may have some home medications which will be placed on hold until you complete the course of blood thinner medication.  It is important for you to complete the blood thinner medication as prescribed.  PRECAUTIONS:  If you experience chest pain or shortness of breath - call 911 immediately for transfer to the hospital emergency department.   If you develop a fever greater that 101 F, purulent drainage  from wound, increased redness or drainage from wound, foul odor from the wound/dressing, or calf pain - CONTACT YOUR SURGEON.                                                   FOLLOW-UP APPOINTMENTS:  If you do not already have a post-op appointment, please call the office for an appointment to be seen by your surgeon.  Guidelines for how soon to be seen are listed in your "After Visit Summary", but are typically between 1-4 weeks after surgery.  OTHER INSTRUCTIONS:   Knee Replacement:  Do not place pillow under knee, focus on keeping the knee straight while resting. CPM instructions: 0-90 degrees, 2 hours in the morning, 2 hours in the afternoon, and 2 hours in the evening. Place foam block, curve side up under heel at all times except when in CPM or when walking.  DO NOT modify, tear, cut, or change the foam block in any way.  MAKE SURE YOU:  Understand these instructions.  Get help right away if you are not doing well or get worse.    Thank you for letting us be a part of your medical care team.  It is a privilege we respect greatly.  We hope these instructions will help you stay on track for a fast and full recovery!     Do not put a pillow under the knee. Place it under the heel.    Complete by:  As directed   Place gray foam block, curve side up under heel at all times except when in CPM or when walking.  DO NOT modify, tear, cut, or change in any way the gray foam block.     Increase activity slowly as tolerated    Complete by:  As directed      Patient may shower    Complete by:  As directed   Aquacel dressing is water proof    Wash over it and the whole leg with soap and water at the end of your shower     TED hose    Complete by:  As directed  Use stockings (TED hose) for 2 weeks on both leg(s).  You may remove them at night for sleeping.           Follow-up Information    Follow up with Lorn Junes, MD On 07/12/2015.   Specialty:  Orthopedic Surgery   Why:  appt time 3  pm   Contact information:   [address] [place] [place] [address] [phone]        Signed: Linda Hedges 06/30/2015, 8:47 AM

## 2015-06-30 NOTE — Progress Notes (Signed)
Reviewed discharge information with Patient and patient's husband.  They have no questions.  Nancy Burch Nancy Burch 12:08 PM 06/30/2015

## 2015-06-30 NOTE — Progress Notes (Signed)
Physical Therapy Treatment Patient Details Name: Nancy Burch MRN: [id number] DOB: 1960-10-04 Today's Date: 06/30/2015    History of Present Illness Pt is a 55 y.o. female now s/p Rt TKA. PMH: asthma, depression.     PT Comments    Patient is making good progress with PT.  From a mobility standpoint anticipate patient will be ready for DC home with husband support. Patient's husband present for session. Both the patient and her spouse deny any questions or concerns following PT session.      Follow Up Recommendations  Home health PT;Supervision/Assistance - 24 hour     Equipment Recommendations  None recommended by PT    Recommendations for Other Services       Precautions / Restrictions Precautions Precautions: Knee Restrictions Weight Bearing Restrictions: Yes RLE Weight Bearing: Weight bearing as tolerated    Mobility  Bed Mobility               General bed mobility comments: up in chair upon arrival  Transfers Overall transfer level: Needs assistance Equipment used: Rolling walker (2 wheeled) Transfers: Sit to/from Stand Sit to Stand: Supervision         General transfer comment: supervision for safety  Ambulation/Gait Ambulation/Gait assistance: Supervision Ambulation Distance (Feet): 200 Feet Assistive device: Rolling walker (2 wheeled) Gait Pattern/deviations: Step-through pattern Gait velocity: decreased   General Gait Details: even strides, mild decresed weight shift Rt.    Stairs            Wheelchair Mobility    Modified Rankin (Stroke Patients Only)       Balance Overall balance assessment: Needs assistance Sitting-balance support: No upper extremity supported Sitting balance-Leahy Scale: Good     Standing balance support: During functional activity Standing balance-Leahy Scale: Fair Standing balance comment: rw with ambulation                    Cognition Arousal/Alertness: Awake/alert Behavior During  Therapy: WFL for tasks assessed/performed Overall Cognitive Status: Within Functional Limits for tasks assessed                      Exercises Total Joint Exercises Ankle Circles/Pumps: AROM;Both;10 reps Quad Sets: Strengthening;Right;10 reps Short Arc Quad: Strengthening;Right;10 reps Heel Slides: AAROM;Right;10 reps Hip ABduction/ADduction: Strengthening;Right;10 reps Straight Leg Raises: Strengthening;Right;10 reps Long Arc Quad: Strengthening;Right;10 reps Knee Flexion: AROM;Right;10 reps Goniometric ROM: 91 degrees flexion    General Comments        Pertinent Vitals/Pain Pain Assessment: 0-10 Pain Score: 3  Pain Location: Rt knee Pain Descriptors / Indicators: Sore Pain Intervention(s): Monitored during session;Limited activity within patient's tolerance    Home Living Family/patient expects to be discharged to:: Private residence Living Arrangements: Spouse/significant other                  Prior Function            PT Goals (current goals can now be found in the care plan section) Acute Rehab PT Goals Patient Stated Goal: go back home PT Goal Formulation: With patient Time For Goal Achievement: 07/13/15 Potential to Achieve Goals: Good Progress towards PT goals: Progressing toward goals    Frequency  7X/week    PT Plan Current plan remains appropriate    Co-evaluation             End of Session Equipment Utilized During Treatment: Gait belt Activity Tolerance: Patient tolerated treatment well Patient left: in chair;with call bell/phone within reach (  in knee extension)     Time: [id number] PT Time Calculation (min) (ACUTE ONLY): 25 min  Charges:  $Gait Training: 8-22 mins $Therapeutic Exercise: 8-22 mins                    G Codes:      Cassell Clement, PT, CSCS Pager [phone] Office [phone]  06/30/2015, 1:26 PM

## 2015-07-05 ENCOUNTER — Ambulatory Visit: Payer: 59 | Admitting: Cardiology

## 2015-07-14 ENCOUNTER — Other Ambulatory Visit: Payer: Self-pay

## 2015-07-14 DIAGNOSIS — R490 Dysphonia: Secondary | ICD-10-CM

## 2015-09-27 LAB — BASIC METABOLIC PANEL
BUN: 26 — AB (ref 4–21)
CREATININE: 1 (ref 0.5–1.1)
Glucose: 106
Glucose: 106 mg/dL
Potassium: 4.4 (ref 3.4–5.3)
SODIUM: 141 (ref 137–147)

## 2015-09-27 LAB — TSH
TSH: 4.2 (ref 0.41–5.90)
TSH: 4.2 u[IU]/mL (ref <=5.90)

## 2015-09-27 LAB — LIPID PANEL
CHOLESTEROL: 228 — AB (ref 0–200)
HDL: 50 (ref 35–70)
LDL CALC: 138 mg/dL
LDL Cholesterol: 138
TRIGLYCERIDES: 200 — AB (ref 40–160)

## 2015-09-27 LAB — HEPATIC FUNCTION PANEL
ALT: 37 — AB (ref 7–35)
AST: 23 (ref 13–35)
Alkaline Phosphatase: 72 (ref 25–125)
Bilirubin, Total: 0.2

## 2015-09-27 LAB — CBC AND DIFFERENTIAL
HCT: 43 (ref 36–46)
Hemoglobin: 14.6 (ref 12.0–16.0)
Platelets: 350 (ref 150–399)
WBC: 5.3

## 2015-09-27 LAB — VITAMIN D 25 HYDROXY (VIT D DEFICIENCY, FRACTURES): Vit D, 25-Hydroxy: 42.5

## 2015-12-27 ENCOUNTER — Ambulatory Visit (INDEPENDENT_AMBULATORY_CARE_PROVIDER_SITE_OTHER): Payer: 59 | Admitting: Endocrinology

## 2015-12-27 ENCOUNTER — Encounter: Payer: Self-pay | Admitting: Endocrinology

## 2015-12-27 VITALS — BP 122/88 | HR 65 | Ht 64.0 in | Wt 244.0 lb

## 2015-12-27 DIAGNOSIS — E038 Other specified hypothyroidism: Secondary | ICD-10-CM

## 2015-12-27 DIAGNOSIS — E063 Autoimmune thyroiditis: Secondary | ICD-10-CM

## 2015-12-27 LAB — T3, FREE: T3 FREE: 3.7 pg/mL (ref 2.3–4.2)

## 2015-12-27 LAB — T4, FREE: FREE T4: 0.81 ng/dL (ref 0.60–1.60)

## 2015-12-27 LAB — TSH: TSH: 3.19 u[IU]/mL (ref 0.35–4.50)

## 2015-12-27 NOTE — Progress Notes (Signed)
Patient ID: Nancy Burch, female   DOB: 1960-03-22, 55 y.o.   MRN: [id number]             Reason for Appointment:  Hypothyroidism, new visit    History of Present Illness:   Hypothyroidism was first diagnosed in 04/2012  At the time of diagnosis patient was having symptoms of hoarseness,  fatigue, cold sensitivity, dry skin, 10-15 pounds weight gain, hair loss.          Baseline TSH level appears to be 3.1, available from current electronic records Her endocrinologist felt that she had subclinical hypothyroidism and started her on 50 g of levothyroxine She also apparently had positive antithyroid antibodies  However the patient did not subjectively feeling better with taking Synthroid Even with 50 g of levothyroxine her TSH was about the same in 6/14 at 3.4 and she was told to go up to 75 g of levothyroxine She did not feel any improvement with this even though her TSH was down to 1.03 subsequently  The patient continues to have the same symptoms as above and is also concerned about her difficulty losing weight which fluctuates up or down 10 pounds She has had follow-up labs done from her PCP since 2015 and TSH has been variable this year, ranging from 1.99 up to 4.2         Patient's weight history is as follows:   Wt Readings from Last 3 Encounters:  06/28/15 248 lb 12.8 oz (112.9 kg)  06/22/15 248 lb 12.8 oz (112.9 kg)  06/17/15 245 lb 12.8 oz (111.5 kg)    Thyroid function results have been as follows:   Lab Results  Component Value Date   TSH 3.093 05/30/2012    LEFT thyroid nodule: This was diagnosed in 2014 and the last patient showed follicular cells.  Follow-up ultrasound subsequently for 2 years have shown stability or improvement in the size of the nodule, last ultrasound was in 05/2014    Past Medical History:  Diagnosis Date  . Arthritis   . Asthma   . Depression   . GERD (gastroesophageal reflux disease)   . 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 dermatologist 06/16/2015  . Hashimoto's thyroiditis 06/16/2015  . Hypothyroidism   . Primary localized osteoarthritis of right knee 06/16/2015  . Pure hypercholesterolemia 06/16/2015    Past Surgical History:  Procedure Laterality Date  . BIOPSY THYROID  01/28/2013  . [place]  . TOTAL KNEE ARTHROPLASTY Right 06/28/2015   Procedure: TOTAL KNEE ARTHROPLASTY;  Surgeon: Elsie Saas, MD;  Location: [facility];  Service: Orthopedics;  Laterality: Right;  . WISDOM TOOTH EXTRACTION      Family History  Problem Relation Age of Onset  . Breast cancer Mother   . Melanoma Father   . Diabetes Sister   . Colon cancer Sister 6  . Stomach cancer Neg Hx     Social History:  reports that she quit smoking about 10 years ago. She has never used smokeless tobacco. She reports that she drinks about 1.2 oz of alcohol per week . She reports that she does not use drugs.  Allergies:  Allergies  Allergen Reactions  . Milk-Related Compounds     Stomach cramps, diarrhea  . Penicillins Hives      Medication List       Accurate as of 12/27/15 11:43 AM. Always use your most recent med list.          ADVAIR DISKUS  100-50 MCG/DOSE Aepb Generic drug:  Fluticasone-Salmeterol Inhale 1 puff into the lungs 2 (two) times daily as needed.   albuterol 108 (90 Base) MCG/ACT inhaler Commonly known as:  PROVENTIL HFA;VENTOLIN HFA Inhale 2 puffs into the lungs every 6 (six) hours as needed for wheezing.   celecoxib 200 MG capsule Commonly known as:  CELEBREX 1 tab po q day with food for pain and  swelling   docusate sodium 100 MG capsule Commonly known as:  COLACE 1 tab 2 times a day while on narcotics.  STOOL SOFTENER   doxycycline 100 MG EC tablet Commonly known as:  DORYX Take 100 mg by mouth daily. with food   DULoxetine 60 MG capsule Commonly known as:  CYMBALTA Take 1 capsule by mouth daily.   enoxaparin 30 MG/0.3ML injection Commonly known as:  LOVENOX Inject  0.3 mLs (30 mg total) into the skin every 12 (twelve) hours.   famotidine 10 MG tablet Commonly known as:  PEPCID Take 10 mg by mouth 2 (two) times daily.   fenofibrate 160 MG tablet Take 1 tablet by mouth daily.   levothyroxine 75 MCG tablet Commonly known as:  SYNTHROID, LEVOTHROID Take 75 mcg by mouth daily before breakfast.   loratadine 10 MG tablet Commonly known as:  CLARITIN Take 10 mg by mouth daily.   multivitamin with minerals tablet Take 1 tablet by mouth daily.   oxyCODONE 5 MG immediate release tablet Commonly known as:  Oxy IR/ROXICODONE 1-2 tablets every 4-6 hrs as needed for breakthrough pain   oxyCODONE 20 mg 12 hr tablet Commonly known as:  OXYCONTIN Take 1 tablet (20 mg total) by mouth every 12 (twelve) hours.   polyethylene glycol packet Commonly known as:  MIRALAX / GLYCOLAX 17grams in 16 oz of water twice a day until bowel movement.  LAXITIVE.  Restart if two days since last bowel movement   spironolactone 50 MG tablet Commonly known as:  ALDACTONE Take 50 mg by mouth daily.   tacrolimus 0.1 % ointment Commonly known as:  PROTOPIC Apply 1 application topically at bedtime.   Vitamin D3 5000 units Tabs Take 1 tablet by mouth daily.       Review of Systems:  Review of Systems  Constitutional: Negative for weight loss and weight gain.  HENT: Negative for trouble swallowing.   Eyes: Negative for blurred vision.  Respiratory: Positive for daytime sleepiness.   Gastrointestinal: Negative for abdominal pain.  Endocrine:       Has had complaints of decreased libido Has also vaginal dryness not improved with estrogen cream was given by PCP  Musculoskeletal: Negative for joint pain.  Skin: Positive for rash.       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  Psychiatric/Behavioral: Positive for depressed mood.Negative for insomnia.       Long-standing history of  depression, relatively better now and she wants to stop Cymbalta               Examination:    Ht 5\' 4"  (1.626 m)   LMP 08/16/2011   GENERAL:  Average build. Has generalized obesity, no cushingoid features  No pallor, clubbing, lymphadenopathy or edema.  Skin: Irregular slightly raised slightly pigmented areas of the left side of face mostly laterally  EYES:  No prominence of the eyes or swelling of the eyelids  ENT: Oral mucosa and tongue normal.  THYROID:  Not palpable on the right.  Indistinct  firm area palpable on swallowing on the left side  HEART:  Normal  S1 and S2; no murmur or click.  CHEST:    Lungs: Vescicular breath sounds heard equally.  No crepitations/ wheeze.  ABDOMEN:  No distention.  Liver and spleen not palpable.  No other mass or tenderness.  NEUROLOGICAL: Reflexes are bilaterally normal at ankles and difficult to elicit at biceps.  JOINTS:  Normal.   Assessment:  Hashimoto's thyroiditis with mild hypothyroidism. Although she has had only one documented high TSH level of 5.6 in 07/2014 she complains of symptoms of fatigue, cold intolerance and hair loss despite taking thyroid supplements since 2014  History of thyroid nodule on the left, followed up last in 2016 with continued stable size  Decreased libido, hair loss: Unlikely that these are related to thyroid disease and she will need to discuss with PCP   PLAN:   Will check her free T4 and free T3 levels which are not available Discussed that if her free T3 free level is low normal she may benefit from a combination of levothyroxine and Cytomel However unclear if her fatigue is related to thyroid dysfunction  For her thyroid nodule: No further ultrasounds are needed since she has had a stable to smaller nodule on the left side on a two-year follow-up  Follow-up to be determined after labs available.  Recommended that she may try HRT for short-term from her PCP for symptomatic relief of  decreased libido and vaginal dryness   Azavion Bouillon 12/27/2015, 11:43 AM

## 2015-12-27 NOTE — Progress Notes (Signed)
Please let patient know that the labs including T3 levels are normal and did not think her symptoms are thyroid related.  She can try 88 g of levothyroxine instead of 75 and will need to see her back in 2 months with labs again

## 2015-12-28 ENCOUNTER — Other Ambulatory Visit: Payer: Self-pay

## 2015-12-28 MED ORDER — LEVOTHYROXINE SODIUM 88 MCG PO TABS: 88.0000 ug | ORAL_TABLET | Freq: Every day | ORAL | 3 refills | Status: DC

## 2016-03-08 ENCOUNTER — Other Ambulatory Visit: Payer: Self-pay | Admitting: Endocrinology

## 2016-03-08 DIAGNOSIS — E063 Autoimmune thyroiditis: Secondary | ICD-10-CM

## 2016-03-10 ENCOUNTER — Other Ambulatory Visit: Payer: 59

## 2016-03-15 ENCOUNTER — Ambulatory Visit: Payer: 59 | Admitting: Endocrinology

## 2016-10-02 LAB — CBC AND DIFFERENTIAL
HCT: 42 (ref 36–46)
Hemoglobin: 14.2 (ref 12.0–16.0)
PLATELETS: 317 (ref 150–399)
WBC: 5.3

## 2016-10-03 LAB — LIPID PANEL
Cholesterol: 263 — AB (ref 0–200)
HDL: 38 (ref 35–70)
LDL Cholesterol: 182
Triglycerides: 216 — AB (ref 40–160)

## 2016-10-03 LAB — BASIC METABOLIC PANEL
BUN: 17 (ref 4–21)
CREATININE: 1 (ref <=1.1)
GLUCOSE: 87
Potassium: 4.7 (ref 3.4–5.3)
Sodium: 143 (ref 137–147)

## 2016-10-03 LAB — TSH: TSH: 2.26 (ref <=5.90)

## 2016-10-03 LAB — HEPATIC FUNCTION PANEL
ALK PHOS: 90 (ref 25–125)
ALT: 49 — AB (ref 7–35)
AST: 26 (ref 13–35)
BILIRUBIN, TOTAL: 0.3

## 2016-11-09 DIAGNOSIS — E669 Obesity, unspecified: Secondary | ICD-10-CM | POA: Insufficient documentation

## 2016-11-09 DIAGNOSIS — E66812 Obesity, class 2: Secondary | ICD-10-CM | POA: Insufficient documentation

## 2017-01-04 LAB — BASIC METABOLIC PANEL
BUN: 19 (ref 4–21)
Creatinine: 0.8 (ref 0.5–1.1)
GLUCOSE: 96
Potassium: 4.7 (ref 3.4–5.3)
SODIUM: 145 (ref 137–147)

## 2017-01-04 LAB — HEPATIC FUNCTION PANEL
ALT: 0 — AB (ref 7–35)
AST: 193 — AB (ref 13–35)
Alkaline Phosphatase: 210 — AB (ref 25–125)
Bilirubin, Total: 0.4

## 2017-01-04 LAB — VITAMIN D 25 HYDROXY (VIT D DEFICIENCY, FRACTURES): VIT D 25 HYDROXY: 65.5

## 2017-01-04 LAB — LIPID PANEL
Cholesterol: 179 (ref 0–200)
HDL: 48 (ref 35–70)
LDL CALC: 98
TRIGLYCERIDES: 165 — AB (ref 40–160)

## 2017-01-05 ENCOUNTER — Other Ambulatory Visit: Payer: Self-pay | Admitting: Family Medicine

## 2017-01-05 DIAGNOSIS — R7989 Other specified abnormal findings of blood chemistry: Secondary | ICD-10-CM

## 2017-01-05 DIAGNOSIS — R945 Abnormal results of liver function studies: Principal | ICD-10-CM

## 2017-01-09 ENCOUNTER — Ambulatory Visit
Admission: RE | Admit: 2017-01-09 | Discharge: 2017-01-09 | Disposition: A | Discharge status: Home / Self Care | Payer: 59 | Source: Ambulatory Visit | Attending: Family Medicine | Admitting: Family Medicine

## 2017-01-09 DIAGNOSIS — R945 Abnormal results of liver function studies: Principal | ICD-10-CM

## 2017-01-09 DIAGNOSIS — R7989 Other specified abnormal findings of blood chemistry: Secondary | ICD-10-CM

## 2017-01-17 LAB — HEPATIC FUNCTION PANEL
AST: 32 (ref 13–35)
Alkaline Phosphatase: 149 — AB (ref 25–125)
BILIRUBIN DIRECT: 0.1 (ref 0.01–0.4)
Bilirubin, Total: 0.4

## 2017-04-05 LAB — HEPATIC FUNCTION PANEL
ALT: 30 (ref 7–35)
AST: 15 (ref 13–35)
Alkaline Phosphatase: 124 (ref 25–125)
BILIRUBIN DIRECT: 0.11 (ref 0.01–0.4)
Bilirubin, Total: 0.5

## 2017-04-05 LAB — LIPID PANEL
Cholesterol: 254 — AB (ref 0–200)
HDL: 38 (ref 35–70)
LDL Cholesterol: 175
Triglycerides: 205 — AB (ref 40–160)

## 2017-04-05 LAB — CBC AND DIFFERENTIAL
HCT: 42 (ref 36–46)
Hemoglobin: 14.1 (ref 12.0–16.0)
Platelets: 336 (ref 150–399)
WBC: 6.6

## 2017-04-05 LAB — BASIC METABOLIC PANEL
BUN: 15 (ref 4–21)
Creatinine: 0.8 (ref 0.5–1.1)
GLUCOSE: 87
POTASSIUM: 4.5 (ref 3.4–5.3)
SODIUM: 143 (ref 137–147)

## 2017-04-05 LAB — TSH: TSH: 2.73 (ref 0.41–5.90)

## 2017-04-05 LAB — VITAMIN D 25 HYDROXY (VIT D DEFICIENCY, FRACTURES): Vit D, 25-Hydroxy: 59.1

## 2017-04-05 LAB — VITAMIN B12: VITAMIN B 12: 346

## 2017-04-05 LAB — HEMOGLOBIN A1C: Hemoglobin A1C: 5.2

## 2017-07-30 ENCOUNTER — Encounter: Payer: Self-pay | Admitting: Family Medicine

## 2017-07-30 ENCOUNTER — Ambulatory Visit: Payer: 59 | Admitting: Family Medicine

## 2017-07-30 VITALS — BP 120/78 | HR 70 | Ht 64.0 in | Wt 215.4 lb

## 2017-07-30 DIAGNOSIS — E063 Autoimmune thyroiditis: Secondary | ICD-10-CM | POA: Diagnosis not present

## 2017-07-30 DIAGNOSIS — M159 Polyosteoarthritis, unspecified: Secondary | ICD-10-CM | POA: Diagnosis not present

## 2017-07-30 DIAGNOSIS — E038 Other specified hypothyroidism: Secondary | ICD-10-CM

## 2017-07-30 DIAGNOSIS — K8020 Calculus of gallbladder without cholecystitis without obstruction: Secondary | ICD-10-CM | POA: Diagnosis not present

## 2017-07-30 DIAGNOSIS — E782 Mixed hyperlipidemia: Secondary | ICD-10-CM | POA: Insufficient documentation

## 2017-07-30 MED ORDER — LEVOTHYROXINE SODIUM 150 MCG PO TABS: 150.0000 ug | ORAL_TABLET | Freq: Every day | ORAL | 3 refills | Status: DC

## 2017-07-30 NOTE — Patient Instructions (Signed)
Cholelithiasis Cholelithiasis is a form of gallbladder disease in which gallstones form in the gallbladder. The gallbladder is an organ that stores bile. Bile is made in the liver, and it helps to digest fats. Gallstones begin as small crystals and slowly grow into stones. They may cause no symptoms until the gallbladder tightens (contracts) and a gallstone is blocking the duct (gallbladder attack), which can cause pain. Cholelithiasis is also referred to as gallstones. There are two main types of gallstones:  Cholesterol stones. These are made of hardened cholesterol and are usually yellow-green in color. They are the most common type of gallstone. Cholesterol is a white, waxy, fat-like substance that is made in the liver.  Pigment stones. These are dark in color and are made of a red-yellow substance that forms when hemoglobin from red blood cells breaks down (bilirubin).  What are the causes? This condition may be caused by an imbalance in the substances that bile is made of. This can happen if the bile:  Has too much bilirubin.  Has too much cholesterol.  Does not have enough bile salts. These salts help the body absorb and digest fats.  In some cases, this condition can also be caused by the gallbladder not emptying completely or often enough. What increases the risk? The following factors may make you more likely to develop this condition:  Being female.  Having multiple pregnancies. Health care providers sometimes advise removing diseased gallbladders before future pregnancies.  Eating a diet that is heavy in fried foods, fat, and refined carbohydrates, like white bread and white rice.  Being obese.  Being older than age 48.  Prolonged use of medicines that contain female hormones (estrogen).  Having diabetes mellitus.  Rapidly losing weight.  Having a family history of gallstones.  Being of [place] or [place] descent.  Having an intestinal disease such as  Crohn disease.  Having metabolic syndrome.  Having cirrhosis.  Having severe types of anemia such as sickle cell anemia.  What are the signs or symptoms? In most cases, there are no symptoms. These are known as silent gallstones. If a gallstone blocks the bile ducts, it can cause a gallbladder attack. The main symptom of a gallbladder attack is sudden pain in the upper right abdomen. The pain usually comes at night or after eating a large meal. The pain can last for one or several hours and can spread to the right shoulder or chest. If the bile duct is blocked for more than a few hours, it can cause infection or inflammation of the gallbladder, liver, or pancreas, which may cause:  Nausea.  Vomiting.  Abdominal pain that lasts for 5 hours or more.  Fever or chills.  Yellowing of the skin or the whites of the eyes (jaundice).  Dark urine.  Light-colored stools.  How is this diagnosed? This condition may be diagnosed based on:  A physical exam.  Your medical history.  An ultrasound of your gallbladder.  CT scan.  MRI.  Blood tests to check for signs of infection or inflammation.  A scan of your gallbladder and bile ducts (biliary system) using nonharmful radioactive material and special cameras that can see the radioactive material (cholescintigram). This test checks to see how your gallbladder contracts and whether bile ducts are blocked.  Inserting a small tube with a camera on the end (endoscope) through your mouth to inspect bile ducts and check for blockages (endoscopic retrograde cholangiopancreatogram).  How is this treated? Treatment for gallstones depends on the  severity of the condition. Silent gallstones do not need treatment. If the gallstones cause a gallbladder attack or other symptoms, treatment may be required. Options for treatment include:  Surgery to remove the gallbladder (cholecystectomy). This is the most common treatment.  Medicines to dissolve  gallstones. These are most effective at treating small gallstones. You may need to take medicines for up to 6-12 months.  Shock wave treatment (extracorporeal biliary lithotripsy). In this treatment, an ultrasound machine sends shock waves to the gallbladder to break gallstones into smaller pieces. These pieces can then be passed into the intestines or be dissolved by medicine. This is rarely used.  Removing gallstones through endoscopic retrograde cholangiopancreatogram. A small basket can be attached to the endoscope and used to capture and remove gallstones.  Follow these instructions at home:  Take over-the-counter and prescription medicines only as told by your health care provider.  Maintain a healthy weight and follow a healthy diet. This includes: ? Reducing fatty foods, such as fried food. ? Reducing refined carbohydrates, like white bread and white rice. ? Increasing fiber. Aim for foods like almonds, fruit, and beans.  Keep all follow-up visits as told by your health care provider. This is important. Contact a health care provider if:  You think you have had a gallbladder attack.  You have been diagnosed with silent gallstones and you develop abdominal pain or indigestion. Get help right away if:  You have pain from a gallbladder attack that lasts for more than 2 hours.  You have abdominal pain that lasts for more than 5 hours.  You have a fever or chills.  You have persistent nausea and vomiting.  You develop jaundice.  You have dark urine or light-colored stools. Summary  Cholelithiasis (also called gallstones) is a form of gallbladder disease in which gallstones form in the gallbladder.  This condition is caused by an imbalance in the substances that make up bile. This can happen if the bile has too much cholesterol, too much bilirubin, or not enough bile salts.  You are more likely to develop this condition if you are female, pregnant, using medicines with  estrogen, obese, older than age 58, or have a family history of gallstones. You may also develop gallstones if you have diabetes, an intestinal disease, cirrhosis, or metabolic syndrome.  Treatment for gallstones depends on the severity of the condition. Silent gallstones do not need treatment.  If gallstones cause a gallbladder attack or other symptoms, treatment may be needed. The most common treatment is surgery to remove the gallbladder. This information is not intended to replace advice given to you by your health care provider. Make sure you discuss any questions you have with your health care provider. Document Released: 02/09/2005 Document Revised: 10/31/2015 Document Reviewed: 10/31/2015 Elsevier Interactive Patient Education  2018 [employer].  Cholesterol Cholesterol is a white, waxy, fat-like substance that is needed by the human body in small amounts. The liver makes all the cholesterol we need. Cholesterol is carried from the liver by the blood through the blood vessels. Deposits of cholesterol (plaques) may build up on blood vessel (artery) walls. Plaques make the arteries narrower and stiffer. Cholesterol plaques increase the risk for heart attack and stroke. You cannot feel your cholesterol level even if it is very high. The only way to know that it is high is to have a blood test. Once you know your cholesterol levels, you should keep a record of the test results. Work with your health care provider to  keep your levels in the desired range. What do the results mean?  Total cholesterol is a rough measure of all the cholesterol in your blood.  LDL (low-density lipoprotein) is the "bad" cholesterol. This is the type that causes plaque to build up on the artery walls. You want this level to be low.  HDL (high-density lipoprotein) is the "good" cholesterol because it cleans the arteries and carries the LDL away. You want this level to be high.  Triglycerides are fat that the body  can either burn for energy or store. High levels are closely linked to heart disease. What are the desired levels of cholesterol?  Total cholesterol below 200.  LDL below 100 for people who are at risk, below 70 for people at very high risk.  HDL above 40 is good. A level of 60 or higher is considered to be protective against heart disease.  Triglycerides below 150. How can I lower my cholesterol? Diet Follow your diet program as told by your health care provider.  Choose fish or white meat chicken and [place], roasted or baked. Limit fatty cuts of red meat, fried foods, and processed meats, such as sausage and lunch meats.  Eat lots of fresh fruits and vegetables.  Choose whole grains, beans, pasta, potatoes, and cereals.  Choose olive oil, corn oil, or canola oil, and use only small amounts.  Avoid butter, mayonnaise, shortening, or palm kernel oils.  Avoid foods with trans fats.  Drink skim or nonfat milk and eat low-fat or nonfat yogurt and cheeses. Avoid whole milk, cream, ice cream, egg yolks, and full-fat cheeses.  Healthier desserts include angel food cake, ginger snaps, animal crackers, hard candy, popsicles, and low-fat or nonfat frozen yogurt. Avoid pastries, cakes, pies, and cookies.  Exercise  Follow your exercise program as told by your health care provider. A regular program: ? Helps to decrease LDL and raise HDL. ? Helps with weight control.  Do things that increase your activity level, such as gardening, walking, and taking the stairs.  Ask your health care provider about ways that you can be more active in your daily life.  Medicine  Take over-the-counter and prescription medicines only as told by your health care provider. ? Medicine may be prescribed by your health care provider to help lower cholesterol and decrease the risk for heart disease. This is usually done if diet and exercise have failed to bring down cholesterol levels. ? If you have several  risk factors, you may need medicine even if your levels are normal.  This information is not intended to replace advice given to you by your health care provider. Make sure you discuss any questions you have with your health care provider. Document Released: 11/08/2000 Document Revised: 09/11/2015 Document Reviewed: 08/14/2015 Elsevier Interactive Patient Education  2018 [employer].  Dyslipidemia Dyslipidemia is an imbalance of waxy, fat-like substances (lipids) in the blood. The body needs lipids in small amounts. Dyslipidemia often involves a high level of cholesterol or triglycerides, which are types of lipids. Common forms of dyslipidemia include:  High levels of bad cholesterol (LDL cholesterol). LDL is the type of cholesterol that causes fatty deposits (plaques) to build up in the blood vessels that carry blood away from your heart (arteries).  Low levels of good cholesterol (HDL cholesterol). HDL cholesterol is the type of cholesterol that protects against heart disease. High levels of HDL remove the LDL buildup from arteries.  High levels of triglycerides. Triglycerides are a fatty substance in the blood  that is linked to a buildup of plaques in the arteries.  You can develop dyslipidemia because of the genes you are born with (primary dyslipidemia) or changes that occur during your life (secondary dyslipidemia), or as a side effect of certain medical treatments. What are the causes? Primary dyslipidemia is caused by changes (mutations) in genes that are passed down through families (inherited). These mutations cause several types of dyslipidemia. Mutations can result in disorders that make the body produce too much LDL cholesterol or triglycerides, or not enough HDL cholesterol. These disorders may lead to heart disease, arterial disease, or stroke at an early age. Causes of secondary dyslipidemia include certain lifestyle choices and diseases that lead to dyslipidemia, such  as:  Eating a diet that is high in animal fat.  Not getting enough activity or exercise (having a sedentary lifestyle).  Having diabetes, kidney disease, liver disease, or thyroid disease.  Drinking large amounts of alcohol.  Using certain types of drugs.  What increases the risk? You may be at greater risk for dyslipidemia if you are an older man or if you are a woman who has gone through menopause. Other risk factors include:  Having a family history of dyslipidemia.  Taking certain medicines, including birth control pills, steroids, some diuretics, beta-blockers, and some medicines forHIV.  Smoking cigarettes.  Eating a high-fat diet.  Drinking large amounts of alcohol.  Having certain medical conditions such as diabetes, polycystic ovary syndrome (PCOS), pregnancy, kidney disease, liver disease, or hypothyroidism.  Not exercising regularly.  Being overweight or obese with too much belly fat.  What are the signs or symptoms? Dyslipidemia does not usually cause any symptoms. Very high lipid levels can cause fatty bumps under the skin (xanthomas) or a white or gray ring around the black center (pupil) of the eye. Very high triglyceride levels can cause inflammation of the pancreas (pancreatitis). How is this diagnosed? Your health care provider may diagnose dyslipidemia based on a routine blood test (fasting blood test). Because most people do not have symptoms of the condition, this blood testing (lipid profile) is done on adults age 37 and older and is repeated every 5 years. This test checks:  Total cholesterol. This is a measure of the total amount of cholesterol in your blood, including LDL cholesterol, HDL cholesterol, and triglycerides. A healthy number is below 200.  LDL cholesterol. The target number for LDL cholesterol is different for each person, depending on individual risk factors. For most people, a number below 100 is healthy. Ask your health care provider  what your LDL cholesterol number should be.  HDL cholesterol. An HDL level of 60 or higher is best because it helps to protect against heart disease. A number below 60 for men or below 2 for women increases the risk for heart disease.  Triglycerides. A healthy triglyceride number is below 150.  If your lipid profile is abnormal, your health care provider may do other blood tests to get more information about your condition. How is this treated? Treatment depends on the type of dyslipidemia that you have and your other risk factors for heart disease and stroke. Your health care provider will have a target range for your lipid levels based on this information. For many people, treatment starts with lifestyle changes, such as diet and exercise. Your health care provider may recommend that you:  Get regular exercise.  Make changes to your diet.  Quit smoking if you smoke.  If diet changes and exercise do not help  you reach your goals, your health care provider may also prescribe medicine to lower lipids. The most commonly prescribed type of medicine lowers your LDL cholesterol (statin drug). If you have a high triglyceride level, your provider may prescribe another type of drug (fibrate) or an omega-3 fish oil supplement, or both. Follow these instructions at home:  Take over-the-counter and prescription medicines only as told by your health care provider. This includes supplements.  Get regular exercise. Start an aerobic exercise and strength training program as told by your health care provider. Ask your health care provider what activities are safe for you. Your health care provider may recommend: ? 30 minutes of aerobic activity 4-6 days a week. Brisk walking is an example of aerobic activity. ? Strength training 2 days a week.  Eat a healthy diet as told by your health care provider. This can help you reach and maintain a healthy weight, lower your LDL cholesterol, and raise your HDL  cholesterol. It may help to work with a diet and nutrition specialist (dietitian) to make a plan that is right for you. Your dietitian or health care provider may recommend: ? Limiting your calories, if you are overweight. ? Eating more fruits, vegetables, whole grains, fish, and lean meats. ? Limiting saturated fat, trans fat, and cholesterol.  Follow instructions from your health care provider or dietitian about eating or drinking restrictions.  Limit alcohol intake to no more than one drink per day for nonpregnant women and two drinks per day for men. One drink equals 12 oz of beer, 5 oz of wine, or 1 oz of hard liquor.  Do not use any products that contain nicotine or tobacco, such as cigarettes and e-cigarettes. If you need help quitting, ask your health care provider.  Keep all follow-up visits as told by your health care provider. This is important. Contact a health care provider if:  You are having trouble sticking to your exercise or diet plan.  You are struggling to quit smoking or control your use of alcohol. Summary  Dyslipidemia is an imbalance of waxy, fat-like substances (lipids) in the blood. The body needs lipids in small amounts. Dyslipidemia often involves a high level of cholesterol or triglycerides, which are types of lipids.  Treatment depends on the type of dyslipidemia that you have and your other risk factors for heart disease and stroke.  For many people, treatment starts with lifestyle changes, such as diet and exercise. Your health care provider may also prescribe medicine to lower lipids. This information is not intended to replace advice given to you by your health care provider. Make sure you discuss any questions you have with your health care provider. Document Released: 02/18/2013 Document Revised: 10/11/2015 Document Reviewed: 10/11/2015 Elsevier Interactive Patient Education  [employer].

## 2017-07-30 NOTE — Progress Notes (Signed)
Subjective:  Patient ID: Nancy Burch, female    DOB: 03-10-1960  Age: 57 y.o. MRN: [id number]  CC: Establish Care   HPI Nancy Burch presents for transfer of care to me in follow-up of her medical issues to include cholelithiasis, hypothyroidism, mixed hyperlipidemia.  TSH from February 1 100 and and 50 mcg of levothyroxine was 2.73 she has been out of her meds for a couple of weeks.  Abdominal ultrasound in November of last year showed multiple gallstones and normal liver.  To her knowledge she has never had a gallstone attack.  Of special note she has lost 33 pounds since May of last year up till now.  She is followed by the bariatric clinic and is currently taking Q Samia with some success.  Her lipid profile is of concern her triglycerides and LDL cholesterol are both elevated.  Lipid profile from 3 months ago showed triglycerides of 254 and an LDL of 175.  She is taking fenofibrate in the past but is no longer.  She was started on atorvastatin that led to a bump in her liver enzymes.  She has osteoarthritis involving both of her knees.  She is status post right total knee replacement.  She tells me that she is in need of a knee replacement on the left as well.  She has a past medical history spinal listhesis, spondylosis, and degenerative disc disease involving her lower back.  A spinal fusion has been recommended by her current treating surgeon.  She tells me that her exercise capability is limited due to her back issues.  She quit smoking in 2003.  She rarely drinks alcohol.  She does not use illicit drugs.  Last Pap smear was last year.  Outpatient Medications Prior to Visit  Medication Sig Dispense Refill  . Cholecalciferol (VITAMIN D3) 5000 units TABS Take 1 tablet by mouth daily.    . clindamycin (CLEOCIN) 150 MG capsule TAKE 4 CAPSULES 30-60 MINUTES BEFORE PROCEDURE  0  . famotidine (PEPCID) 40 MG tablet Take 1 tablet by mouth daily.  10  . fexofenadine (ALLEGRA) 180 MG tablet Take  180 mg by mouth daily.    Marland Kitchen omeprazole (PRILOSEC) 40 MG capsule Take 1 capsule by mouth daily.  2  . Phentermine-Topiramate (QSYMIA) 7.5-46 MG CP24 Take 1 capsule by mouth daily.    Marland Kitchen levothyroxine (SYNTHROID, LEVOTHROID) 75 MCG tablet Take 75 mcg by mouth 2 (two) times daily.     Marland Kitchen albuterol (PROVENTIL HFA;VENTOLIN HFA) 108 (90 BASE) MCG/ACT inhaler Inhale 2 puffs into the lungs every 6 (six) hours as needed for wheezing. (Patient not taking: Reported on 12/27/2015) 1 Inhaler 2  . docusate sodium (COLACE) 100 MG capsule 1 tab 2 times a day while on narcotics.  STOOL SOFTENER 60 capsule 0  . doxycycline (DORYX) 100 MG EC tablet Take 100 mg by mouth daily. with food  1  . DULoxetine (CYMBALTA) 60 MG capsule Take 1 capsule by mouth daily.  1  . famotidine (PEPCID) 10 MG tablet Take 10 mg by mouth 2 (two) times daily.    . fenofibrate 160 MG tablet Take 1 tablet by mouth daily.  3  . Fluticasone-Salmeterol (ADVAIR DISKUS) 100-50 MCG/DOSE AEPB Inhale 1 puff into the lungs 2 (two) times daily as needed.    Marland Kitchen levothyroxine (SYNTHROID, LEVOTHROID) 88 MCG tablet Take 1 tablet (88 mcg total) by mouth daily. 90 tablet 3  . loratadine (CLARITIN) 10 MG tablet Take 10 mg by mouth daily.    Marland Kitchen  Multiple Vitamins-Minerals (MULTIVITAMIN WITH MINERALS) tablet Take 1 tablet by mouth daily.    Marland Kitchen oxyCODONE (OXY IR/ROXICODONE) 5 MG immediate release tablet 1-2 tablets every 4-6 hrs as needed for breakthrough pain (Patient not taking: Reported on 12/27/2015) 100 tablet 0  . oxyCODONE (OXYCONTIN) 20 mg 12 hr tablet Take 1 tablet (20 mg total) by mouth every 12 (twelve) hours. (Patient not taking: Reported on 12/27/2015) 30 tablet 0  . polyethylene glycol (MIRALAX / GLYCOLAX) packet 17grams in 16 oz of water twice a day until bowel movement.  LAXITIVE.  Restart if two days since last bowel movement (Patient not taking: Reported on 12/27/2015) 14 each 0  . spironolactone (ALDACTONE) 50 MG tablet Take 50 mg by mouth daily.  0    . tacrolimus (PROTOPIC) 0.1 % ointment Apply 1 application topically at bedtime.  1   No facility-administered medications prior to visit.     ROS Review of Systems  Constitutional: Negative for chills, fatigue, fever and unexpected weight change.  HENT: Negative.   Eyes: Negative.   Respiratory: Negative.   Cardiovascular: Negative.   Gastrointestinal: Negative.   Endocrine: Negative for cold intolerance and heat intolerance.  Genitourinary: Negative.   Musculoskeletal: Positive for arthralgias and back pain.  Skin: Negative for pallor and wound.  Allergic/Immunologic: Negative for immunocompromised state.  Neurological: Negative for weakness.  Hematological: Does not bruise/bleed easily.  Psychiatric/Behavioral: Negative.     Objective:  BP 120/78   Pulse 70   Ht 5\' 4"  (1.626 m)   Wt 215 lb 6 oz (97.7 kg)   LMP 08/16/2011   SpO2 97%   BMI 36.97 kg/m   BP Readings from Last 3 Encounters:  07/30/17 120/78  12/27/15 122/88  06/30/15 132/65    Wt Readings from Last 3 Encounters:  07/30/17 215 lb 6 oz (97.7 kg)  12/27/15 244 lb (110.7 kg)  06/28/15 248 lb 12.8 oz (112.9 kg)    Physical Exam  Constitutional: She is oriented to person, place, and time. She appears well-developed and well-nourished. No distress.  HENT:  Head: Normocephalic and atraumatic.  Right Ear: External ear normal.  Left Ear: External ear normal.  Eyes: Right eye exhibits no discharge. Left eye exhibits no discharge. No scleral icterus.  Neck: No tracheal deviation present.  Pulmonary/Chest: Breath sounds normal.  Neurological: She is alert and oriented to person, place, and time.  Skin: She is not diaphoretic.  Psychiatric: She has a normal mood and affect. Her behavior is normal.    Lab Results  Component Value Date   WBC 6.6 04/05/2017   HGB 14.1 04/05/2017   HCT 42 04/05/2017   PLT 336 04/05/2017   GLUCOSE 163 (H) 06/30/2015   CHOL 254 (A) 04/05/2017   TRIG 205 (A) 04/05/2017    HDL 38 04/05/2017   LDLCALC 175 04/05/2017   ALT 30 04/05/2017   AST 15 04/05/2017   NA 143 04/05/2017   K 4.5 04/05/2017   CL 100 (L) 06/30/2015   CREATININE 0.8 04/05/2017   BUN 15 04/05/2017   CO2 27 06/30/2015   TSH 2.73 04/05/2017   INR 1.03 06/17/2015   HGBA1C 5.2 04/05/2017    US Abdomen Complete  Result Date: 01/09/2017 CLINICAL DATA:  Elevated LFTs. EXAM: ABDOMEN ULTRASOUND COMPLETE COMPARISON:  No recent prior. FINDINGS: Gallbladder: Multiple gallstones measuring up to 1.8 cm. Gallbladder wall thickness 2.8 mm. Negative Murphy sign. No pericholecystic fluid collections. Common bile duct: Diameter: 5.8 mm Liver: No focal lesion identified. Within normal limits  in parenchymal echogenicity. Portal vein is patent on color Doppler imaging with normal direction of blood flow towards the liver. IVC: No abnormality visualized. Pancreas: Visualized portion unremarkable. Spleen: Size and appearance within normal limits. Right Kidney: Length: 11.0 cm. Echogenicity within normal limits. No mass or hydronephrosis visualized. Left Kidney: Length: 12.2 cm. Echogenicity within normal limits. No mass or hydronephrosis visualized. Abdominal aorta: No aneurysm visualized. Other findings: None. IMPRESSION: Multiple gallstones. No evidence of cholecystitis or biliary distention . Electronically Signed   By: Marcello Moores  Register   On: 01/09/2017 10:35    Assessment & Plan:   Ayse was seen today for establish care.  Diagnoses and all orders for this visit:  Calculus of gallbladder without cholecystitis without obstruction  Hypothyroidism due to Hashimoto's thyroiditis -     levothyroxine (SYNTHROID, LEVOTHROID) 150 MCG tablet; Take 1 tablet (150 mcg total) by mouth daily.  Mixed hyperlipidemia  Morbid obesity (HCC)  Osteoarthritis of multiple joints, unspecified osteoarthritis type   I have discontinued Lilia Argue. Herbison's albuterol, doxycycline, DULoxetine, fenofibrate, spironolactone,  tacrolimus, Fluticasone-Salmeterol, multivitamin with minerals, loratadine, docusate sodium, oxyCODONE, oxyCODONE, polyethylene glycol, levothyroxine, and levothyroxine. I am also having her start on levothyroxine. Additionally, I am having her maintain her Vitamin D3, omeprazole, famotidine, clindamycin, Phentermine-Topiramate, and fexofenadine.  Meds ordered this encounter  Medications  . levothyroxine (SYNTHROID, LEVOTHROID) 150 MCG tablet    Sig: Take 1 tablet (150 mcg total) by mouth daily.    Dispense:  90 tablet    Refill:  3   Expressed my concern about her elevated LDL cholesterol.  She is quite serious about losing more weight.  She has no history of a vascular event and has no evidence of vascular disease at this time.  She does not have risk factors for vascular disease other than her elevated LDL cholesterol.  We both decided to give her another 6 months to lose weight and remeasure her lipid profile at that time.  If a statin is required I will use one other than atorvastatin.  We had a long decision about cholelithiasis.  Discussed what she should look for with a gallstone attack and that it could turn into a medical emergency.  She will seek emergency care with any acute abdominal pain.  Discussed the option of referral to a general surgeon and watchful waiting.  She elected watchful waiting and will let me know if she would like to proceed with a referral prior to her six-month follow-up.  Follow-up: Return in about 6 months (around 01/29/2018).  Libby Maw, MD

## 2017-08-03 ENCOUNTER — Encounter: Payer: Self-pay | Admitting: Family Medicine

## 2017-08-22 DIAGNOSIS — E038 Other specified hypothyroidism: Secondary | ICD-10-CM | POA: Diagnosis not present

## 2017-08-22 DIAGNOSIS — Z6837 Body mass index (BMI) 37.0-37.9, adult: Secondary | ICD-10-CM | POA: Diagnosis not present

## 2017-08-22 DIAGNOSIS — E669 Obesity, unspecified: Secondary | ICD-10-CM | POA: Diagnosis not present

## 2017-08-28 ENCOUNTER — Ambulatory Visit: Payer: 59 | Admitting: Nurse Practitioner

## 2017-10-16 ENCOUNTER — Other Ambulatory Visit: Payer: Self-pay | Admitting: Family Medicine

## 2017-11-22 IMAGING — US US ABDOMEN COMPLETE
1 series · 14 of 25 positions shown · non-contrast
Comparison: No recent prior.

CLINICAL DATA: Elevated LFTs.

EXAM:
ABDOMEN ULTRASOUND COMPLETE

[Series 1: us abdomen complete · 0.30mm/px · 14 of 91 slices shown]
[im 1/91]
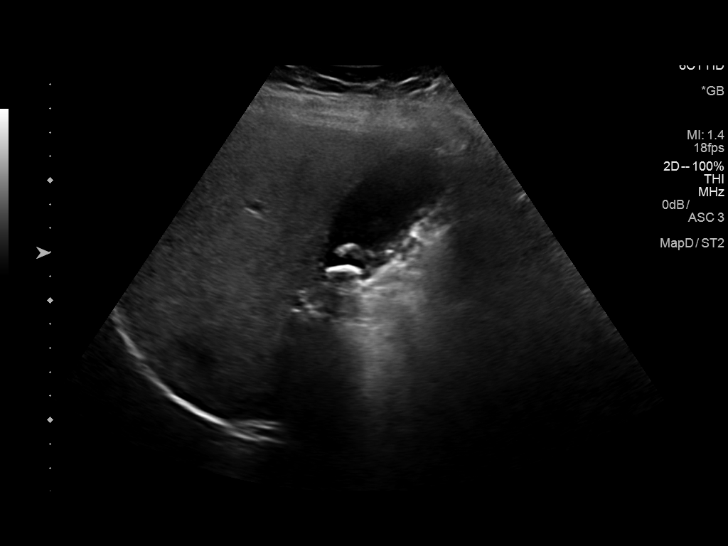
[im 8/91]
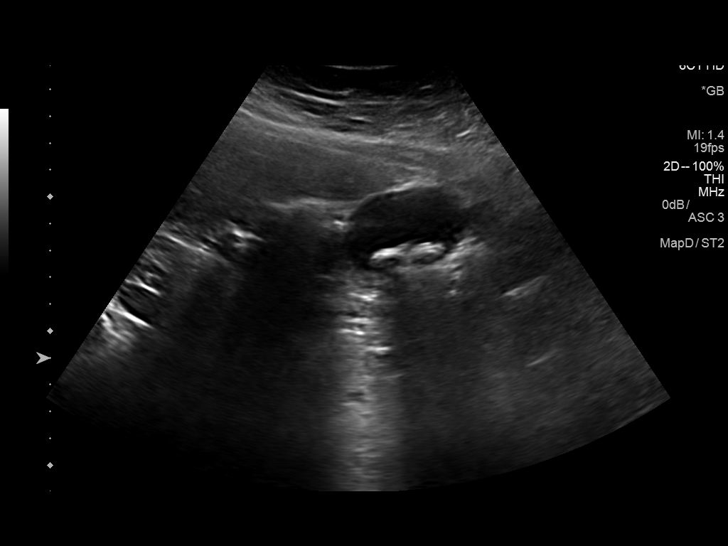
[im 16/91]
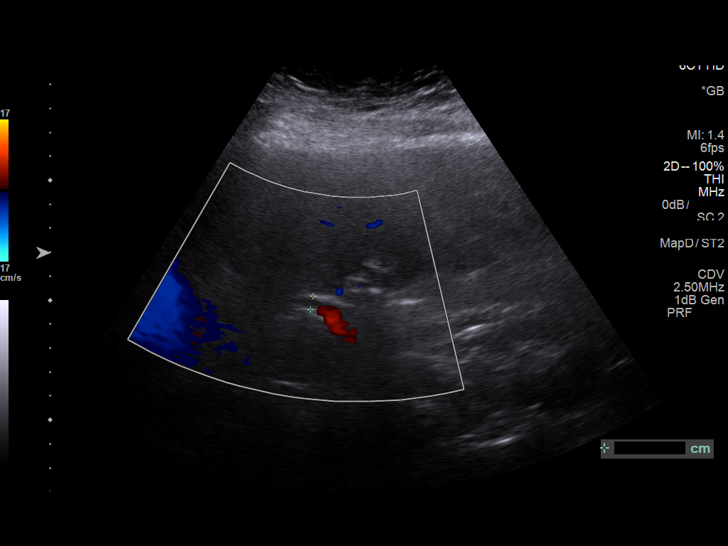
[im 23/91]
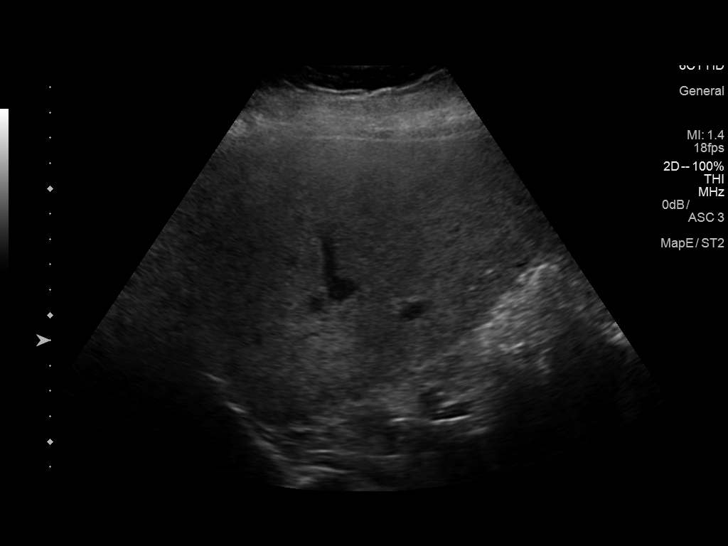
[im 31/91]
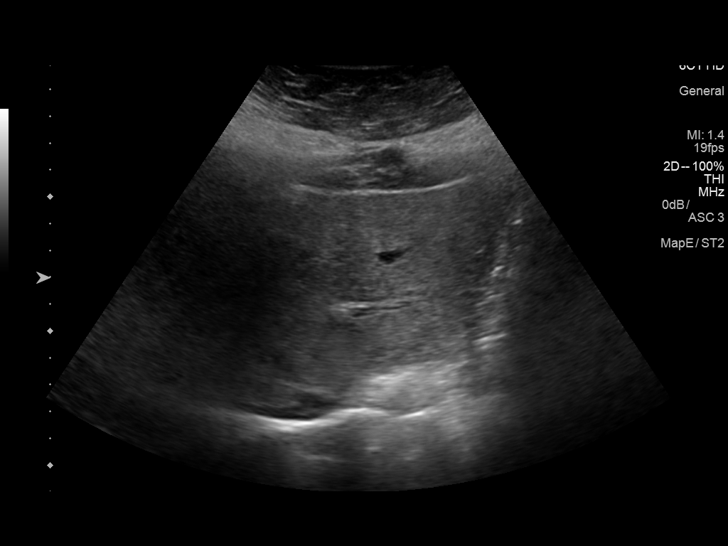
[im 34/91]
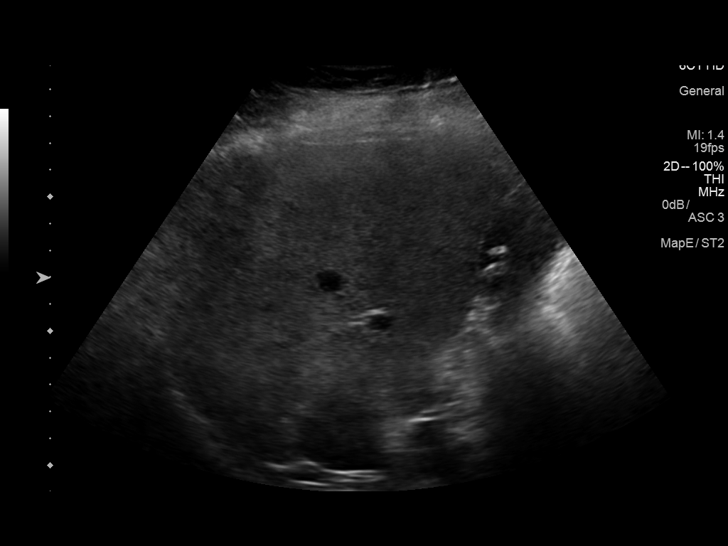
[im 42/91]
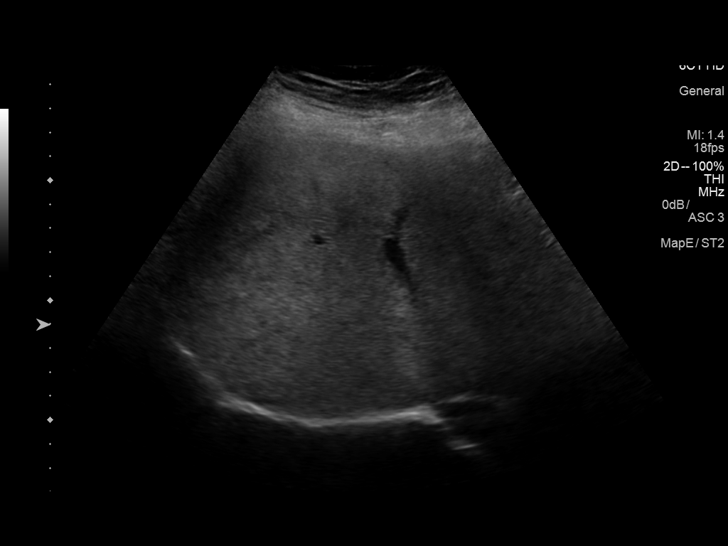
[im 49/91]
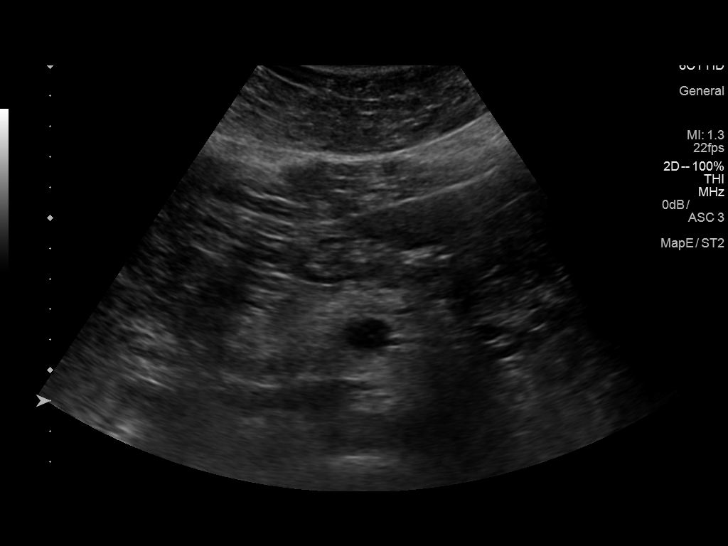
[im 57/91]
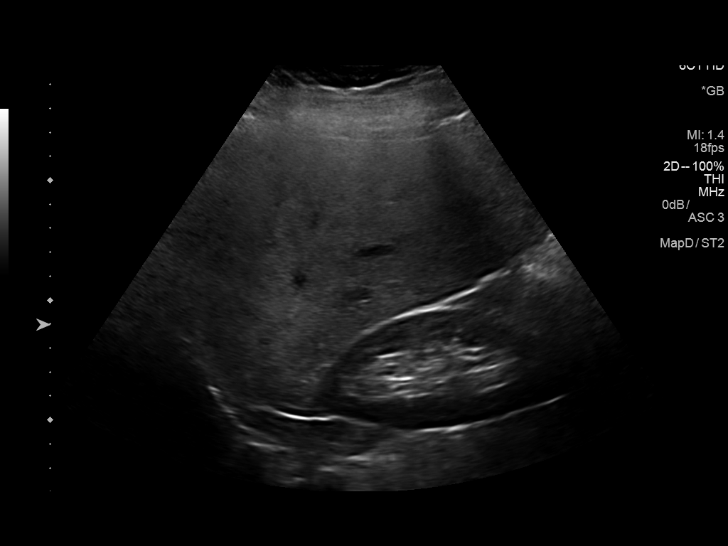
[im 61/91]
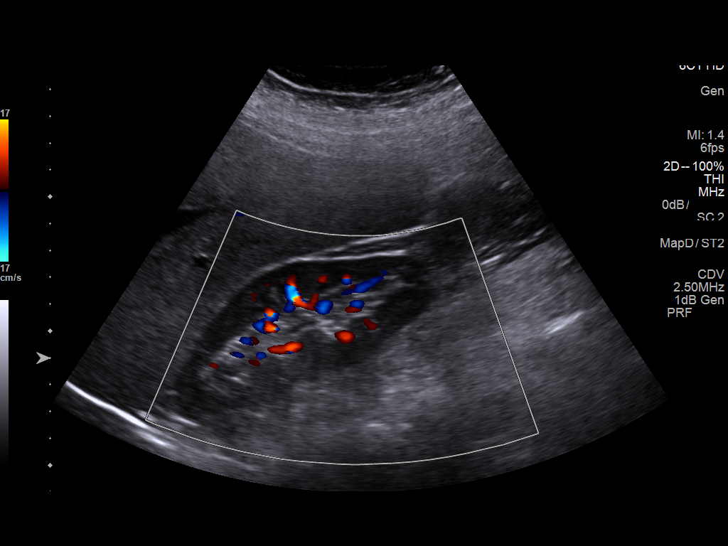
[im 68/91]
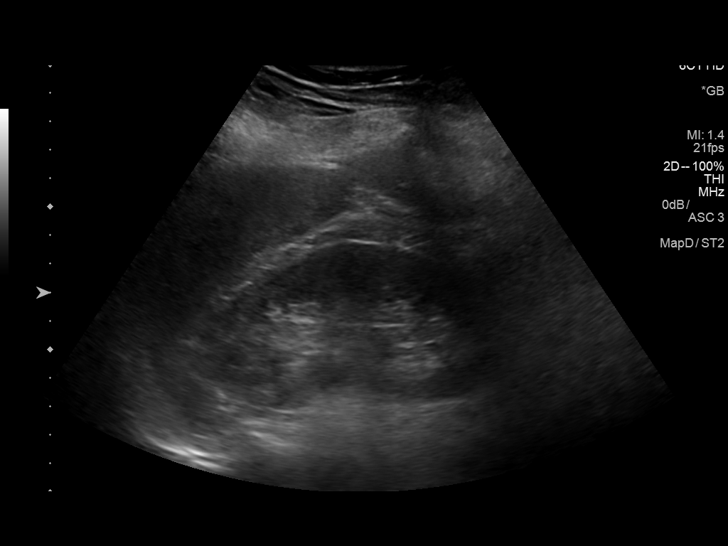
[im 76/91]
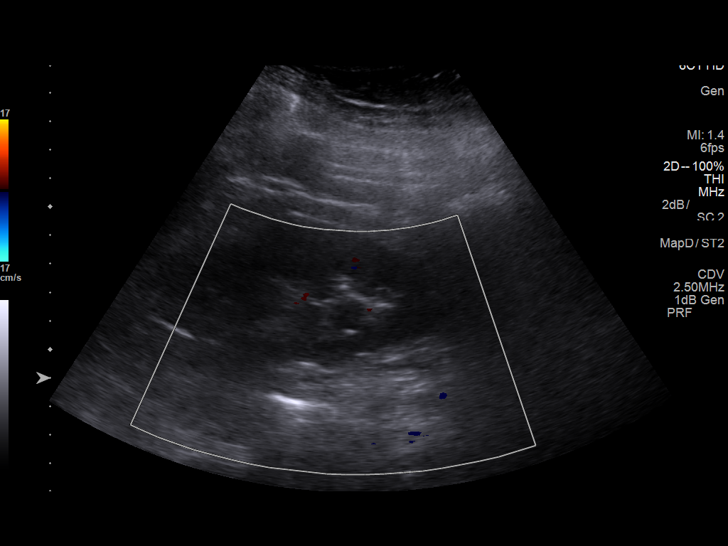
[im 83/91]
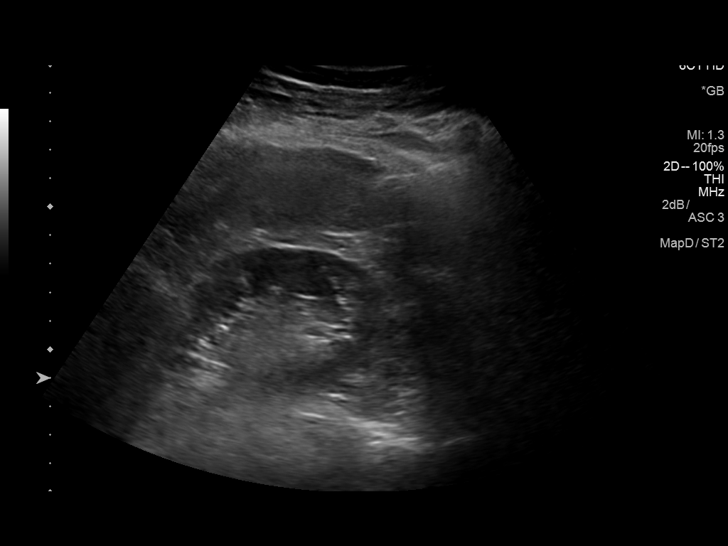
[im 91/91]
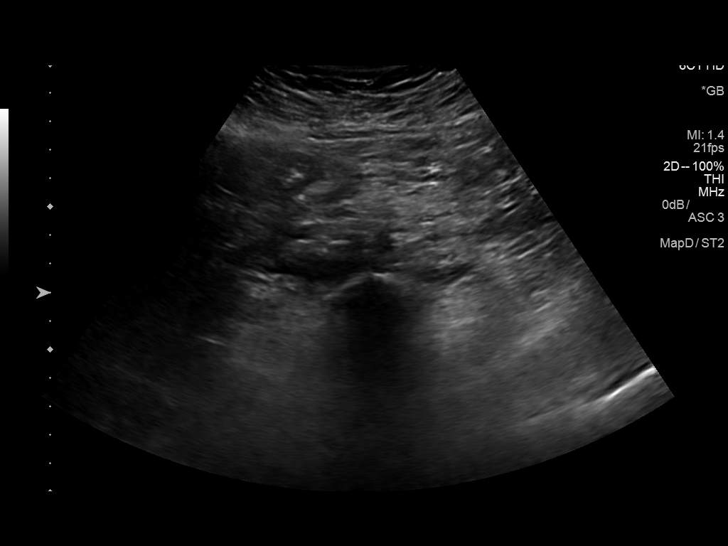

[14 of 25 positions shown; findings below may reference images not displayed]

FINDINGS: Gallbladder: Multiple gallstones measuring up to 1.8 cm. Gallbladder
wall thickness 2.8 mm. Negative Murphy sign. No pericholecystic
fluid collections.

Common bile duct: Diameter: 5.8 mm

Liver: No focal lesion identified. Within normal limits in
parenchymal echogenicity. Portal vein is patent on color Doppler
imaging with normal direction of blood flow towards the liver.

IVC: No abnormality visualized.

Pancreas: Visualized portion unremarkable.

Spleen: Size and appearance within normal limits.

Right Kidney: Length: 11.0 cm. Echogenicity within normal limits. No
mass or hydronephrosis visualized.

Left Kidney: Length: 12.2 cm. Echogenicity within normal limits. No
mass or hydronephrosis visualized.

Abdominal aorta: No aneurysm visualized.

Other findings: None.
IMPRESSION: Multiple gallstones. No evidence of cholecystitis or biliary
distention .

## 2017-12-17 DIAGNOSIS — N951 Menopausal and female climacteric states: Secondary | ICD-10-CM | POA: Diagnosis not present

## 2017-12-17 DIAGNOSIS — N952 Postmenopausal atrophic vaginitis: Secondary | ICD-10-CM | POA: Diagnosis not present

## 2018-01-23 ENCOUNTER — Other Ambulatory Visit: Payer: Self-pay | Admitting: Family Medicine

## 2018-01-29 ENCOUNTER — Encounter: Payer: Self-pay | Admitting: Family Medicine

## 2018-01-29 ENCOUNTER — Ambulatory Visit: Payer: 59 | Admitting: Family Medicine

## 2018-01-29 VITALS — BP 124/78 | HR 74 | Ht 64.0 in | Wt 227.5 lb

## 2018-01-29 DIAGNOSIS — E782 Mixed hyperlipidemia: Secondary | ICD-10-CM

## 2018-01-29 DIAGNOSIS — E063 Autoimmune thyroiditis: Secondary | ICD-10-CM | POA: Diagnosis not present

## 2018-01-29 DIAGNOSIS — F4321 Adjustment disorder with depressed mood: Secondary | ICD-10-CM | POA: Diagnosis not present

## 2018-01-29 DIAGNOSIS — E038 Other specified hypothyroidism: Secondary | ICD-10-CM

## 2018-01-29 LAB — LIPID PANEL
CHOL/HDL RATIO: 5
CHOLESTEROL: 247 mg/dL — AB (ref 0–200)
HDL: 51 mg/dL (ref >39.00)
NonHDL: 195.84
TRIGLYCERIDES: 244 mg/dL — AB (ref 0.0–149.0)
VLDL: 48.8 mg/dL — AB (ref 0.0–40.0)

## 2018-01-29 LAB — CBC
HCT: 42.7 % (ref 36.0–46.0)
HEMOGLOBIN: 14.3 g/dL (ref 12.0–15.0)
MCHC: 33.5 g/dL (ref 30.0–36.0)
MCV: 89.4 fl (ref 78.0–100.0)
PLATELETS: 294 10*3/uL (ref 150.0–400.0)
RBC: 4.78 Mil/uL (ref 3.87–5.11)
RDW: 12.6 % (ref 11.5–15.5)
WBC: 5.2 10*3/uL (ref 4.0–10.5)

## 2018-01-29 LAB — COMPREHENSIVE METABOLIC PANEL
ALT: 13 U/L (ref 0–35)
AST: 12 U/L (ref 0–37)
Albumin: 4.1 g/dL (ref 3.5–5.2)
Alkaline Phosphatase: 86 U/L (ref 39–117)
BUN: 17 mg/dL (ref 6–23)
CALCIUM: 9 mg/dL (ref 8.4–10.5)
CHLORIDE: 104 meq/L (ref 96–112)
CO2: 25 meq/L (ref 19–32)
Creatinine, Ser: 0.84 mg/dL (ref 0.40–1.20)
GFR: 74.01 mL/min (ref >60.00)
Glucose, Bld: 103 mg/dL — ABNORMAL HIGH (ref 70–99)
Potassium: 4.4 mEq/L (ref 3.5–5.1)
Sodium: 138 mEq/L (ref 135–145)
Total Bilirubin: 0.4 mg/dL (ref 0.2–1.2)
Total Protein: 6.4 g/dL (ref 6.0–8.3)

## 2018-01-29 LAB — URINALYSIS, ROUTINE W REFLEX MICROSCOPIC
Bilirubin Urine: NEGATIVE
Hgb urine dipstick: NEGATIVE
Leukocytes, UA: NEGATIVE
Nitrite: NEGATIVE
RBC / HPF: NONE SEEN (ref 0–?)
Specific Gravity, Urine: >=1.03 — AB (ref 1.000–1.030)
Urine Glucose: NEGATIVE
Urobilinogen, UA: 0.2 (ref 0.0–1.0)
pH: 5 (ref 5.0–8.0)

## 2018-01-29 LAB — LDL CHOLESTEROL, DIRECT: Direct LDL: 153 mg/dL

## 2018-01-29 LAB — TSH: TSH: 0.21 u[IU]/mL — ABNORMAL LOW (ref 0.35–4.50)

## 2018-01-29 MED ORDER — ATORVASTATIN CALCIUM 20 MG PO TABS: 20.0000 mg | ORAL_TABLET | Freq: Every day | ORAL | 1 refills | Status: DC

## 2018-01-29 NOTE — Progress Notes (Addendum)
Established Patient Office Visit  Subjective:  Patient ID: Nancy Burch, female    DOB: 1960/11/10  Age: 57 y.o. MRN: [id number]  CC:  Chief Complaint  Patient presents with  . Follow-up    HPI Nancy DENHERDER presents for a follow up.  For follow-up of her lipids and hypothyroidism.  She has been off lipid therapy because we were giving weight loss and diet therapy a chance to work for her.  She has been unable to exercise due to chronic lower back pain.  Effusion has been recommended for her.  Things are well at home and at work but there has been some ongoing sadness.  She had 2 children.  Her 48 year old son survives.  She lost her daughter at age 18 6 years ago.  This child succumbed to a pulmonary embolism believed to be associated with contraceptive therapy.  She has an 66-year-old grandchild that she continues to care for every other weekend from this daughter.  Her father died at age 7 from a melanoma.  Patient continues to have yearly dermatological checks.  She is seeing the eye doctor yearly.  There is a strong family history of macular degeneration.  She sees GYN for her Pap and pelvic exams.  Past Medical History:  Diagnosis Date  . Arthritis   . Asthma   . Depression   . GERD (gastroesophageal reflux disease)   . 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 Hashimoto's thyroiditis 06/16/2015  . Hypothyroidism   . Primary localized osteoarthritis of right knee 06/16/2015  . Pure hypercholesterolemia 06/16/2015    Past Surgical History:  Procedure Laterality Date  . BIOPSY THYROID  01/28/2013  . [place]  . TOTAL KNEE ARTHROPLASTY Right 06/28/2015   Procedure: TOTAL KNEE ARTHROPLASTY;  Surgeon: Elsie Saas, MD;  Location: [place];  Service: Orthopedics;  Laterality: Right;  . WISDOM TOOTH EXTRACTION      Family History  Problem Relation Age of Onset  . Diabetes Sister   . Colon cancer Sister 35  . Thyroid  disease Sister   . Breast cancer Mother   . Thyroid disease Mother   . Melanoma Father   . Diabetes Sister   . Stomach cancer Neg Hx     Social History   Socioeconomic History  . Marital status: Married    Spouse name: Johnnette Litter  . Number of children: Not on file  . Years of education: Not on file  . Highest education level: Not on file  Occupational History  . Occupation: controller  Social Needs  . Financial resource strain: Not on file  . Food insecurity:    Worry: Not on file    Inability: Not on file  . Transportation needs:    Medical: Not on file    Non-medical: Not on file  Tobacco Use  . Smoking status: Former Smoker    Last attempt to quit: 06/27/2005    Years since quitting: 12.6  . Smokeless tobacco: Never Used  Substance and Sexual Activity  . Alcohol use: Yes    Alcohol/week: 2.0 standard drinks    Types: 2 Glasses of wine per week    Comment: 2 galsses per month  . Drug use: No  . Sexual activity: Yes  Lifestyle  . Physical activity:    Days per week: Not on file    Minutes per session: Not on file  . Stress: Not on file  Relationships  .  Social connections:    Talks on phone: Not on file    Gets together: Not on file    Attends religious service: Not on file    Active member of club or organization: Not on file    Attends meetings of clubs or organizations: Not on file    Relationship status: Not on file  . Intimate partner violence:    Fear of current or ex partner: Not on file    Emotionally abused: Not on file    Physically abused: Not on file    Forced sexual activity: Not on file  Other Topics Concern  . Not on file  Social History Narrative  . Not on file    Outpatient Medications Prior to Visit  Medication Sig Dispense Refill  . Cholecalciferol (VITAMIN D3) 5000 units TABS Take 1 tablet by mouth daily.    . DULoxetine (CYMBALTA) 60 MG capsule Take 60 mg by mouth daily.    . Estradiol-Norethindrone Acet 0.5-0.1 MG tablet Take 1  tablet by mouth daily.    . famotidine (PEPCID) 40 MG tablet TAKE 1 TABLET (40 MG) BY MOUTH DAILY 90 tablet 1  . fexofenadine (ALLEGRA) 180 MG tablet Take 180 mg by mouth daily.    Marland Kitchen omeprazole (PRILOSEC) 40 MG capsule TAKE 1 CAPSULE (40 MG) BY MOUTH ONE TIME DAILIY 90 capsule 2  . tiZANidine (ZANAFLEX) 4 MG tablet TAKE 1 TABLET BY MOUTH EVERY 6-8 HOURS AS NEEDED FOR MUSCLE SPASMS 60 tablet 1  . levothyroxine (SYNTHROID, LEVOTHROID) 150 MCG tablet Take 1 tablet (150 mcg total) by mouth daily. 90 tablet 3  . Phentermine-Topiramate (QSYMIA) 7.5-46 MG CP24 Take 1 capsule by mouth daily.    . clindamycin (CLEOCIN) 150 MG capsule TAKE 4 CAPSULES 30-60 MINUTES BEFORE PROCEDURE  0   No facility-administered medications prior to visit.     Allergies  Allergen Reactions  . Milk-Related Compounds     Stomach cramps, diarrhea  . Penicillins Hives    ROS Review of Systems  Constitutional: Negative for chills, fatigue, fever and unexpected weight change.  HENT: Negative.   Eyes: Negative for photophobia and visual disturbance.  Respiratory: Negative.   Cardiovascular: Negative.   Gastrointestinal: Negative.   Endocrine: Negative for polyphagia and polyuria.  Genitourinary: Negative.   Musculoskeletal: Negative.   Allergic/Immunologic: Negative for immunocompromised state.  Neurological: Negative for seizures, light-headedness and numbness.  Hematological: Does not bruise/bleed easily.  Psychiatric/Behavioral: Negative.       Objective:    Physical Exam  Constitutional: She is oriented to person, place, and time. She appears well-developed and well-nourished. No distress.  HENT:  Head: Normocephalic and atraumatic.  Right Ear: External ear normal.  Left Ear: External ear normal.  Mouth/Throat: Oropharynx is clear and moist. No oropharyngeal exudate.  Eyes: Pupils are equal, round, and reactive to light. Conjunctivae are normal. Right eye exhibits no discharge. Left eye exhibits no  discharge.  Neck: Neck supple. No JVD present. No tracheal deviation present. No thyromegaly present.  Cardiovascular: Normal rate, regular rhythm and normal heart sounds.  Pulmonary/Chest: Effort normal and breath sounds normal.  Abdominal: Bowel sounds are normal.  Lymphadenopathy:    She has no cervical adenopathy.  Neurological: She is alert and oriented to person, place, and time.  Skin: Skin is warm and dry. She is not diaphoretic.  Psychiatric: She has a normal mood and affect. Her behavior is normal.    BP 124/78   Pulse 74   Ht 5\' 4"  (1.626 m)  Wt 227 lb 8 oz (103.2 kg)   LMP 08/16/2011   SpO2 99%   BMI 39.05 kg/m  Wt Readings from Last 3 Encounters:  01/29/18 227 lb 8 oz (103.2 kg)  07/30/17 215 lb 6 oz (97.7 kg)  12/27/15 244 lb (110.7 kg)   BP Readings from Last 3 Encounters:  01/29/18 124/78  07/30/17 120/78  12/27/15 122/88   Health Maintenance Due  Topic Date Due  . Hepatitis C Screening  10-13-1960  . INFLUENZA VACCINE  09/27/2017  . COLONOSCOPY  01/29/2018    There are no preventive care reminders to display for this patient.  Lab Results  Component Value Date   TSH 0.21 (L) 01/29/2018   Lab Results  Component Value Date   WBC 5.2 01/29/2018   HGB 14.3 01/29/2018   HCT 42.7 01/29/2018   MCV 89.4 01/29/2018   PLT 294.0 01/29/2018   Lab Results  Component Value Date   NA 138 01/29/2018   K 4.4 01/29/2018   CO2 25 01/29/2018   GLUCOSE 103 (H) 01/29/2018   BUN 17 01/29/2018   CREATININE 0.84 01/29/2018   BILITOT 0.4 01/29/2018   ALKPHOS 86 01/29/2018   AST 12 01/29/2018   ALT 13 01/29/2018   PROT 6.4 01/29/2018   ALBUMIN 4.1 01/29/2018   CALCIUM 9.0 01/29/2018   ANIONGAP 10 06/30/2015   GFR 74.01 01/29/2018   Lab Results  Component Value Date   CHOL 247 (H) 01/29/2018   Lab Results  Component Value Date   HDL 51.00 01/29/2018   Lab Results  Component Value Date   LDLCALC 175 04/05/2017   Lab Results  Component Value Date     TRIG 244.0 (H) 01/29/2018   Lab Results  Component Value Date   CHOLHDL 5 01/29/2018   Lab Results  Component Value Date   HGBA1C 5.2 04/05/2017   The ASCVD Risk score Mikey Bussing DC Jr., et al., 2013) failed to calculate for the following reasons:   The patient has a prior MI or stroke diagnosis   Assessment & Plan:   Problem List Items Addressed This Visit      Endocrine   Hypothyroidism due to Hashimoto's thyroiditis   Relevant Medications   levothyroxine (SYNTHROID, LEVOTHROID) 137 MCG tablet   Other Relevant Orders   TSH (Completed)   Urinalysis, Routine w reflex microscopic (Completed)     Other   Mixed hyperlipidemia - Primary   Relevant Medications   pravastatin (PRAVACHOL) 20 MG tablet   Other Relevant Orders   CBC (Completed)   Comprehensive metabolic panel (Completed)   LDL cholesterol, direct (Completed)   Lipid panel (Completed)   Grieving      Meds ordered this encounter  Medications  . DISCONTD: atorvastatin (LIPITOR) 20 MG tablet    Sig: Take 1 tablet (20 mg total) by mouth daily.    Dispense:  90 tablet    Refill:  1  . levothyroxine (SYNTHROID, LEVOTHROID) 137 MCG tablet    Sig: Take 1 tablet (137 mcg total) by mouth daily before breakfast. On a fasting stomach one hour prior to eating.    Dispense:  90 tablet    Refill:  1  . pravastatin (PRAVACHOL) 20 MG tablet    Sig: Take 1 tablet (20 mg total) by mouth at bedtime.    Dispense:  90 tablet    Refill:  3  Patient was given anticipatory guidance low-fat low-cholesterol diet and information on dyslipidemia.  Suggested treatments will pending results of her  blood.  Cholesterol remains elevated.  Patient has had issues taking Lipitor in the past.  We will try pravastatin.  TSH is low and hypothyroidism appears to be over treated.  We will drop the levothyroxine from 150 mcg down to 137 mcg.  Will recheck both in 3 months.  Follow-up: Return in about 3 months (around 04/30/2018), or suggested follow up  will pend results of labs.Marland Kitchen

## 2018-01-29 NOTE — Patient Instructions (Signed)
Fat and Cholesterol Restricted Diet Getting too much fat and cholesterol in your diet may cause health problems. Following this diet helps keep your fat and cholesterol at normal levels. This can keep you from getting sick. What types of fat should I choose?  Choose monosaturated and polyunsaturated fats. These are found in foods such as olive oil, canola oil, flaxseeds, walnuts, almonds, and seeds.  Eat more omega-3 fats. Good choices include salmon, mackerel, sardines, tuna, flaxseed oil, and ground flaxseeds.  Limit saturated fats. These are in animal products such as meats, butter, and cream. They can also be in plant products such as palm oil, palm kernel oil, and coconut oil.  Avoid foods with partially hydrogenated oils in them. These contain trans fats. Examples of foods that have trans fats are stick margarine, some tub margarines, cookies, crackers, and other baked goods. What general guidelines do I need to follow?  Check food labels. Look for the words "trans fat" and "saturated fat."  When preparing a meal: ? Fill half of your plate with vegetables and green salads. ? Fill one fourth of your plate with whole grains. Look for the word "whole" as the first word in the ingredient list. ? Fill one fourth of your plate with lean protein foods.  Eat more foods that have fiber, like apples, carrots, beans, peas, and barley.  Eat more home-cooked foods. Eat less at restaurants and buffets.  Limit or avoid alcohol.  Limit foods high in starch and sugar.  Limit fried foods.  Cook foods without frying them. Baking, boiling, grilling, and broiling are all great options.  Lose weight if you are overweight. Losing even a small amount of weight can help your overall health. It can also help prevent diseases such as diabetes and heart disease. What foods can I eat? Grains Whole grains, such as whole wheat or whole grain breads, crackers, cereals, and pasta. Unsweetened oatmeal,  bulgur, barley, quinoa, or brown rice. Corn or whole wheat flour tortillas. Vegetables Fresh or frozen vegetables (raw, steamed, roasted, or grilled). Green salads. Fruits All fresh, canned (in natural juice), or frozen fruits. Meat and Other Protein Products Ground beef (85% or leaner), grass-fed beef, or beef trimmed of fat. Skinless chicken or turkey. Ground chicken or turkey. Pork trimmed of fat. All fish and seafood. Eggs. Dried beans, peas, or lentils. Unsalted nuts or seeds. Unsalted canned or dry beans. Dairy Low-fat dairy products, such as skim or 1% milk, 2% or reduced-fat cheeses, low-fat ricotta or cottage cheese, or plain low-fat yogurt. Fats and Oils Tub margarines without trans fats. Light or reduced-fat mayonnaise and salad dressings. Avocado. Olive, canola, sesame, or safflower oils. Natural peanut or almond butter (choose ones without added sugar and oil). The items listed above may not be a complete list of recommended foods or beverages. Contact your dietitian for more options. What foods are not recommended? Grains White bread. White pasta. White rice. Cornbread. Bagels, pastries, and croissants. Crackers that contain trans fat. Vegetables White potatoes. Corn. Creamed or fried vegetables. Vegetables in a cheese sauce. Fruits Dried fruits. Canned fruit in light or heavy syrup. Fruit juice. Meat and Other Protein Products Fatty cuts of meat. Ribs, chicken wings, bacon, sausage, bologna, salami, chitterlings, fatback, hot dogs, bratwurst, and packaged luncheon meats. Liver and organ meats. Dairy Whole or 2% milk, cream, half-and-half, and cream cheese. Whole milk cheeses. Whole-fat or sweetened yogurt. Full-fat cheeses. Nondairy creamers and whipped toppings. Processed cheese, cheese spreads, or cheese curds. Sweets and Desserts Corn   syrup, sugars, honey, and molasses. Candy. Jam and jelly. Syrup. Sweetened cereals. Cookies, pies, cakes, donuts, muffins, and ice  cream. Fats and Oils Butter, stick margarine, lard, shortening, ghee, or bacon fat. Coconut, palm kernel, or palm oils. Beverages Alcohol. Sweetened drinks (such as sodas, lemonade, and fruit drinks or punches). The items listed above may not be a complete list of foods and beverages to avoid. Contact your dietitian for more information. This information is not intended to replace advice given to you by your health care provider. Make sure you discuss any questions you have with your health care provider. Document Released: 08/15/2011 Document Revised: 10/21/2015 Document Reviewed: 05/15/2013 Elsevier Interactive Patient Education  2018 [employer].  Dyslipidemia Dyslipidemia is an imbalance of waxy, fat-like substances (lipids) in the blood. The body needs lipids in small amounts. Dyslipidemia often involves a high level of cholesterol or triglycerides, which are types of lipids. Common forms of dyslipidemia include:  High levels of bad cholesterol (LDL cholesterol). LDL is the type of cholesterol that causes fatty deposits (plaques) to build up in the blood vessels that carry blood away from your heart (arteries).  Low levels of good cholesterol (HDL cholesterol). HDL cholesterol is the type of cholesterol that protects against heart disease. High levels of HDL remove the LDL buildup from arteries.  High levels of triglycerides. Triglycerides are a fatty substance in the blood that is linked to a buildup of plaques in the arteries.  You can develop dyslipidemia because of the genes you are born with (primary dyslipidemia) or changes that occur during your life (secondary dyslipidemia), or as a side effect of certain medical treatments. What are the causes? Primary dyslipidemia is caused by changes (mutations) in genes that are passed down through families (inherited). These mutations cause several types of dyslipidemia. Mutations can result in disorders that make the body produce too much  LDL cholesterol or triglycerides, or not enough HDL cholesterol. These disorders may lead to heart disease, arterial disease, or stroke at an early age. Causes of secondary dyslipidemia include certain lifestyle choices and diseases that lead to dyslipidemia, such as:  Eating a diet that is high in animal fat.  Not getting enough activity or exercise (having a sedentary lifestyle).  Having diabetes, kidney disease, liver disease, or thyroid disease.  Drinking large amounts of alcohol.  Using certain types of drugs.  What increases the risk? You may be at greater risk for dyslipidemia if you are an older man or if you are a woman who has gone through menopause. Other risk factors include:  Having a family history of dyslipidemia.  Taking certain medicines, including birth control pills, steroids, some diuretics, beta-blockers, and some medicines forHIV.  Smoking cigarettes.  Eating a high-fat diet.  Drinking large amounts of alcohol.  Having certain medical conditions such as diabetes, polycystic ovary syndrome (PCOS), pregnancy, kidney disease, liver disease, or hypothyroidism.  Not exercising regularly.  Being overweight or obese with too much belly fat.  What are the signs or symptoms? Dyslipidemia does not usually cause any symptoms. Very high lipid levels can cause fatty bumps under the skin (xanthomas) or a white or gray ring around the black center (pupil) of the eye. Very high triglyceride levels can cause inflammation of the pancreas (pancreatitis). How is this diagnosed? Your health care provider may diagnose dyslipidemia based on a routine blood test (fasting blood test). Because most people do not have symptoms of the condition, this blood testing (lipid profile) is done on adults age  20 and older and is repeated every 5 years. This test checks:  Total cholesterol. This is a measure of the total amount of cholesterol in your blood, including LDL cholesterol, HDL  cholesterol, and triglycerides. A healthy number is below 200.  LDL cholesterol. The target number for LDL cholesterol is different for each person, depending on individual risk factors. For most people, a number below 100 is healthy. Ask your health care provider what your LDL cholesterol number should be.  HDL cholesterol. An HDL level of 60 or higher is best because it helps to protect against heart disease. A number below 33 for men or below 80 for women increases the risk for heart disease.  Triglycerides. A healthy triglyceride number is below 150.  If your lipid profile is abnormal, your health care provider may do other blood tests to get more information about your condition. How is this treated? Treatment depends on the type of dyslipidemia that you have and your other risk factors for heart disease and stroke. Your health care provider will have a target range for your lipid levels based on this information. For many people, treatment starts with lifestyle changes, such as diet and exercise. Your health care provider may recommend that you:  Get regular exercise.  Make changes to your diet.  Quit smoking if you smoke.  If diet changes and exercise do not help you reach your goals, your health care provider may also prescribe medicine to lower lipids. The most commonly prescribed type of medicine lowers your LDL cholesterol (statin drug). If you have a high triglyceride level, your provider may prescribe another type of drug (fibrate) or an omega-3 fish oil supplement, or both. Follow these instructions at home:  Take over-the-counter and prescription medicines only as told by your health care provider. This includes supplements.  Get regular exercise. Start an aerobic exercise and strength training program as told by your health care provider. Ask your health care provider what activities are safe for you. Your health care provider may recommend: ? 30 minutes of aerobic activity  4-6 days a week. Brisk walking is an example of aerobic activity. ? Strength training 2 days a week.  Eat a healthy diet as told by your health care provider. This can help you reach and maintain a healthy weight, lower your LDL cholesterol, and raise your HDL cholesterol. It may help to work with a diet and nutrition specialist (dietitian) to make a plan that is right for you. Your dietitian or health care provider may recommend: ? Limiting your calories, if you are overweight. ? Eating more fruits, vegetables, whole grains, fish, and lean meats. ? Limiting saturated fat, trans fat, and cholesterol.  Follow instructions from your health care provider or dietitian about eating or drinking restrictions.  Limit alcohol intake to no more than one drink per day for nonpregnant women and two drinks per day for men. One drink equals 12 oz of beer, 5 oz of wine, or 1 oz of hard liquor.  Do not use any products that contain nicotine or tobacco, such as cigarettes and e-cigarettes. If you need help quitting, ask your health care provider.  Keep all follow-up visits as told by your health care provider. This is important. Contact a health care provider if:  You are having trouble sticking to your exercise or diet plan.  You are struggling to quit smoking or control your use of alcohol. Summary  Dyslipidemia is an imbalance of waxy, fat-like substances (lipids) in the  blood. The body needs lipids in small amounts. Dyslipidemia often involves a high level of cholesterol or triglycerides, which are types of lipids.  Treatment depends on the type of dyslipidemia that you have and your other risk factors for heart disease and stroke.  For many people, treatment starts with lifestyle changes, such as diet and exercise. Your health care provider may also prescribe medicine to lower lipids. This information is not intended to replace advice given to you by your health care provider. Make sure you discuss  any questions you have with your health care provider. Document Released: 02/18/2013 Document Revised: 10/11/2015 Document Reviewed: 10/11/2015 Elsevier Interactive Patient Education  [employer].

## 2018-01-29 NOTE — Addendum Note (Signed)
Addended by: Jon Billings on: 01/29/2018 01:14 PM   Modules accepted: Orders

## 2018-01-30 MED ORDER — LEVOTHYROXINE SODIUM 137 MCG PO TABS: 137.0000 ug | ORAL_TABLET | Freq: Every day | ORAL | 1 refills | Status: DC

## 2018-01-30 MED ORDER — PRAVASTATIN SODIUM 20 MG PO TABS: 20.0000 mg | ORAL_TABLET | Freq: Every day | ORAL | 3 refills | Status: DC

## 2018-01-30 NOTE — Addendum Note (Signed)
Addended by: Abelino Derrick A on: 01/30/2018 11:11 AM   Modules accepted: Orders

## 2018-02-13 DIAGNOSIS — J209 Acute bronchitis, unspecified: Secondary | ICD-10-CM | POA: Diagnosis not present

## 2018-03-05 DIAGNOSIS — M5432 Sciatica, left side: Secondary | ICD-10-CM | POA: Diagnosis not present

## 2018-03-05 DIAGNOSIS — M5431 Sciatica, right side: Secondary | ICD-10-CM | POA: Diagnosis not present

## 2018-03-05 DIAGNOSIS — Q762 Congenital spondylolisthesis: Secondary | ICD-10-CM | POA: Diagnosis not present

## 2018-03-05 DIAGNOSIS — M5430 Sciatica, unspecified side: Secondary | ICD-10-CM | POA: Insufficient documentation

## 2018-03-19 DIAGNOSIS — M545 Low back pain: Secondary | ICD-10-CM | POA: Diagnosis not present

## 2018-03-19 DIAGNOSIS — M48061 Spinal stenosis, lumbar region without neurogenic claudication: Secondary | ICD-10-CM | POA: Diagnosis not present

## 2018-03-19 DIAGNOSIS — Q762 Congenital spondylolisthesis: Secondary | ICD-10-CM | POA: Diagnosis not present

## 2018-03-20 DIAGNOSIS — N950 Postmenopausal bleeding: Secondary | ICD-10-CM | POA: Diagnosis not present

## 2018-03-20 DIAGNOSIS — N951 Menopausal and female climacteric states: Secondary | ICD-10-CM | POA: Diagnosis not present

## 2018-03-24 ENCOUNTER — Other Ambulatory Visit: Payer: Self-pay | Admitting: Family Medicine

## 2018-03-25 DIAGNOSIS — R21 Rash and other nonspecific skin eruption: Secondary | ICD-10-CM | POA: Diagnosis not present

## 2018-03-26 DIAGNOSIS — Q762 Congenital spondylolisthesis: Secondary | ICD-10-CM | POA: Diagnosis not present

## 2018-03-26 DIAGNOSIS — M5432 Sciatica, left side: Secondary | ICD-10-CM | POA: Diagnosis not present

## 2018-03-26 DIAGNOSIS — M5431 Sciatica, right side: Secondary | ICD-10-CM | POA: Diagnosis not present

## 2018-04-04 ENCOUNTER — Encounter: Payer: Self-pay | Admitting: Gastroenterology

## 2018-04-24 ENCOUNTER — Other Ambulatory Visit: Payer: Self-pay | Admitting: Family Medicine

## 2018-05-06 DIAGNOSIS — M25561 Pain in right knee: Secondary | ICD-10-CM | POA: Diagnosis not present

## 2018-05-06 DIAGNOSIS — M48061 Spinal stenosis, lumbar region without neurogenic claudication: Secondary | ICD-10-CM | POA: Diagnosis not present

## 2018-05-06 DIAGNOSIS — M5416 Radiculopathy, lumbar region: Secondary | ICD-10-CM | POA: Diagnosis not present

## 2018-05-07 DIAGNOSIS — M5416 Radiculopathy, lumbar region: Secondary | ICD-10-CM | POA: Insufficient documentation

## 2018-05-14 DIAGNOSIS — J209 Acute bronchitis, unspecified: Secondary | ICD-10-CM | POA: Diagnosis not present

## 2018-05-16 ENCOUNTER — Telehealth: Payer: 59 | Admitting: Family

## 2018-05-16 DIAGNOSIS — J0100 Acute maxillary sinusitis, unspecified: Secondary | ICD-10-CM | POA: Diagnosis not present

## 2018-05-16 DIAGNOSIS — J209 Acute bronchitis, unspecified: Secondary | ICD-10-CM | POA: Diagnosis not present

## 2018-05-16 DIAGNOSIS — B9689 Other specified bacterial agents as the cause of diseases classified elsewhere: Secondary | ICD-10-CM

## 2018-05-16 DIAGNOSIS — R509 Fever, unspecified: Secondary | ICD-10-CM | POA: Diagnosis not present

## 2018-05-16 DIAGNOSIS — J208 Acute bronchitis due to other specified organisms: Principal | ICD-10-CM

## 2018-05-16 MED ORDER — BENZONATATE 200 MG PO CAPS: 200.0000 mg | ORAL_CAPSULE | Freq: Three times a day (TID) | ORAL | 0 refills | Status: DC | PRN

## 2018-05-16 MED ORDER — ALBUTEROL SULFATE HFA 108 (90 BASE) MCG/ACT IN AERS: 2.0000 | INHALATION_SPRAY | Freq: Four times a day (QID) | RESPIRATORY_TRACT | 0 refills | Status: AC | PRN

## 2018-05-16 NOTE — Progress Notes (Signed)
Greater than 5 minutes, yet less than 10 minutes of time have been spent researching, coordinating, and implementing care for this patient today.  Thank you for the details you included in the comment boxes. Those details are very helpful in determining the best course of treatment for you and help [place] to provide the best care.  Given your symptoms, please continue the current course of treatment. I sent something for your cough.   FYI, if you feel your chest pain is radiating down your left arm, or constant, or severe, or with some jaw pain, please go to the emergency room ASAP or call 911. If you feel this pain is coming from your lungs, proceed below.   E-Visit for Corona Virus Screening Based on your current symptoms, it seems unlikely that your symptoms are related to the [place] virus.   Coronavirus disease 2019 (COVID-19) is a respiratory illness that can spread from person to person. The virus that causes COVID-19 is a new virus that was first identified in the country of [place] but is now found in multiple other countries and has spread to the [place].  Symptoms associated with the virus are mild to severe fever, cough, and shortness of breath. There is currently no vaccine to protect against COVID-19, and there is no specific antiviral treatment for the virus.  It is vitally important that if you feel that you have an infection such as this virus or any other virus that you stay home and away from places where you may spread it to others.  Currently, not all patients are being tested. If the symptoms are mild and there is not a known exposure, performing the test is not indicated.  You can use medication such as A prescription cough medication called Tessalon Perles 100 mg. You may take 1-2 capsules every 8 hours as needed for cough and A prescription inhaler called Albuterol MDI 90 mcg /actuation 2 puffs every 4 hours as needed for shortness of breath, wheezing, cough  Reduce your risk  of any infection by using the same precautions used for avoiding the common cold or flu:  Marland Kitchen Wash your hands often with soap and warm water for at least 20 seconds.  If soap and water are not readily available, use an alcohol-based hand sanitizer with at least 60% alcohol.  . If coughing or sneezing, cover your mouth and nose by coughing or sneezing into the elbow areas of your shirt or coat, into a tissue or into your sleeve (not your hands). . Avoid shaking hands with others and consider head nods or verbal greetings only. . Avoid touching your eyes, nose, or mouth with unwashed hands.  . Avoid close contact with people who are sick. . Avoid places or events with large numbers of people in one location, like concerts or sporting events. . Carefully consider travel plans you have or are making. . If you are planning any travel outside or inside the [place], visit the CDC's Travelers' Health webpage for the latest health notices. . If you have some symptoms but not all symptoms, continue to monitor at home and seek medical attention if your symptoms worsen. . If you are having a medical emergency, call 911.  HOME CARE . Only take medications as instructed by your medical team. . Drink plenty of fluids and get plenty of rest. . A steam or ultrasonic humidifier can help if you have congestion.   GET HELP RIGHT AWAY IF: . You develop worsening fever. Marland Kitchen  You become short of breath . You cough up blood. . Your symptoms become more severe MAKE SURE YOU   Understand these instructions.  Will watch your condition.  Will get help right away if you are not doing well or get worse.  Your e-visit answers were reviewed by a board certified advanced clinical practitioner to complete your personal care plan.  Depending on the condition, your plan could have included both over the counter or prescription medications.  If there is a problem please reply once you have received a response from your  provider. Your safety is important to [place].  If you have drug allergies check your prescription carefully.    You can use MyChart to ask questions about today's visit, request a non-urgent call back, or ask for a work or school excuse for 24 hours related to this e-Visit. If it has been greater than 24 hours you will need to follow up with your provider, or enter a new e-Visit to address those concerns. You will get an e-mail in the next two days asking about your experience.  I hope that your e-visit has been valuable and will speed your recovery. Thank you for using e-visits.

## 2018-07-06 DIAGNOSIS — J019 Acute sinusitis, unspecified: Secondary | ICD-10-CM | POA: Diagnosis not present

## 2018-07-06 DIAGNOSIS — J0100 Acute maxillary sinusitis, unspecified: Secondary | ICD-10-CM | POA: Diagnosis not present

## 2018-10-28 ENCOUNTER — Other Ambulatory Visit: Payer: Self-pay | Admitting: Family Medicine

## 2019-01-29 ENCOUNTER — Encounter: Payer: Self-pay | Admitting: Family Medicine

## 2019-01-29 ENCOUNTER — Telehealth (INDEPENDENT_AMBULATORY_CARE_PROVIDER_SITE_OTHER): Payer: 59 | Admitting: Family Medicine

## 2019-01-29 DIAGNOSIS — E063 Autoimmune thyroiditis: Secondary | ICD-10-CM | POA: Diagnosis not present

## 2019-01-29 DIAGNOSIS — M4310 Spondylolisthesis, site unspecified: Secondary | ICD-10-CM | POA: Insufficient documentation

## 2019-01-29 DIAGNOSIS — E038 Other specified hypothyroidism: Secondary | ICD-10-CM | POA: Diagnosis not present

## 2019-01-29 DIAGNOSIS — J0100 Acute maxillary sinusitis, unspecified: Secondary | ICD-10-CM | POA: Diagnosis not present

## 2019-01-29 DIAGNOSIS — K219 Gastro-esophageal reflux disease without esophagitis: Secondary | ICD-10-CM

## 2019-01-29 MED ORDER — OMEPRAZOLE 40 MG PO CPDR: DELAYED_RELEASE_CAPSULE | ORAL | 0 refills | Status: DC

## 2019-01-29 MED ORDER — CLARITHROMYCIN ER 500 MG PO TB24: 1000.0000 mg | ORAL_TABLET | Freq: Every day | ORAL | 0 refills | Status: AC

## 2019-01-29 NOTE — Progress Notes (Signed)
Established Patient Office Visit  Subjective:  Patient ID: Nancy Burch, female    DOB: 04-16-1960  Age: 58 y.o. MRN: [id number]  CC:  Chief Complaint  Patient presents with  . Nasal Congestion    HPI Nancy Burch presents for evaluation and treatment of a 3-week history of URI symptoms that she believes may be associated with allergies.  She has been using Flonase and it is helped with her stuffy nose but she has more recently been experiencing facial pressure with upper jaw pain and brownish postnasal drip.  There is been no fevers chills cough or difficulty breathing.  She does have a history of reactive airway disease.  She is scheduled for lumbar surgery on the 22nd of this month.  Past Medical History:  Diagnosis Date  . Arthritis   . Asthma   . Depression   . GERD (gastroesophageal reflux disease)   . 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 Hashimoto's thyroiditis 06/16/2015  . Hypothyroidism   . Primary localized osteoarthritis of right knee 06/16/2015  . Pure hypercholesterolemia 06/16/2015    Past Surgical History:  Procedure Laterality Date  . BIOPSY THYROID  01/28/2013  . [place]  . TOTAL KNEE ARTHROPLASTY Right 06/28/2015   Procedure: TOTAL KNEE ARTHROPLASTY;  Surgeon: Elsie Saas, MD;  Location: [place];  Service: Orthopedics;  Laterality: Right;  . WISDOM TOOTH EXTRACTION      Family History  Problem Relation Age of Onset  . Diabetes Sister   . Colon cancer Sister 72  . Thyroid disease Sister   . Breast cancer Mother   . Thyroid disease Mother   . Melanoma Father   . Diabetes Sister   . Stomach cancer Neg Hx     Social History   Socioeconomic History  . Marital status: Married    Spouse name: Johnnette Litter  . Number of children: Not on file  . Years of education: Not on file  . Highest education level: Not on file  Occupational History  . Occupation: controller  Social Needs  .  Financial resource strain: Not on file  . Food insecurity    Worry: Not on file    Inability: Not on file  . Transportation needs    Medical: Not on file    Non-medical: Not on file  Tobacco Use  . Smoking status: Former Smoker    Quit date: 06/27/2005    Years since quitting: 13.6  . Smokeless tobacco: Never Used  Substance and Sexual Activity  . Alcohol use: Yes    Alcohol/week: 2.0 standard drinks    Types: 2 Glasses of wine per week    Comment: 2 galsses per month  . Drug use: No  . Sexual activity: Yes  Lifestyle  . Physical activity    Days per week: Not on file    Minutes per session: Not on file  . Stress: Not on file  Relationships  . Social Herbalist on phone: Not on file    Gets together: Not on file    Attends religious service: Not on file    Active member of club or organization: Not on file    Attends meetings of clubs or organizations: Not on file    Relationship status: Not on file  . Intimate partner violence    Fear of current or ex partner: Not on file    Emotionally abused: Not on file  Physically abused: Not on file    Forced sexual activity: Not on file  Other Topics Concern  . Not on file  Social History Narrative  . Not on file    Outpatient Medications Prior to Visit  Medication Sig Dispense Refill  . albuterol (PROVENTIL HFA;VENTOLIN HFA) 108 (90 Base) MCG/ACT inhaler Inhale 2 puffs into the lungs every 6 (six) hours as needed for shortness of breath. 1 Inhaler 0  . benzonatate (TESSALON) 200 MG capsule Take 1 capsule (200 mg total) by mouth every 8 (eight) hours as needed for cough. 30 capsule 0  . Cholecalciferol (VITAMIN D3) 5000 units TABS Take 1 tablet by mouth daily.    . DULoxetine (CYMBALTA) 60 MG capsule Take 60 mg by mouth daily.    . Estradiol-Norethindrone Acet 0.5-0.1 MG tablet Take 1 tablet by mouth daily.    . famotidine (PEPCID) 40 MG tablet TAKE 1 TABLET (40 MG) BY MOUTH DAILY 90 tablet 2  . fexofenadine  (ALLEGRA) 180 MG tablet Take 180 mg by mouth daily.    Marland Kitchen levothyroxine (SYNTHROID, LEVOTHROID) 137 MCG tablet Take 1 tablet (137 mcg total) by mouth daily before breakfast. On a fasting stomach one hour prior to eating. 90 tablet 1  . pravastatin (PRAVACHOL) 20 MG tablet Take 1 tablet (20 mg total) by mouth at bedtime. 90 tablet 3  . tiZANidine (ZANAFLEX) 4 MG tablet TAKE 1 TABLET BY MOUTH EVERY 6-8 HOURS AS NEEDED FOR MUSCLE SPASMS 60 tablet 1  . omeprazole (PRILOSEC) 40 MG capsule TAKE 1 CAPSULE (40 MG) BY MOUTH ONE TIME DAILIY 90 capsule 0   No facility-administered medications prior to visit.     Allergies  Allergen Reactions  . Penicillins Hives    ROS Review of Systems  Constitutional: Negative for chills, diaphoresis, fatigue, fever and unexpected weight change.  HENT: Positive for congestion, dental problem, postnasal drip, sinus pressure and sinus pain. Negative for rhinorrhea.   Eyes: Negative for photophobia and visual disturbance.  Respiratory: Negative for chest tightness, shortness of breath and wheezing.   Cardiovascular: Negative.   Gastrointestinal: Negative.   Genitourinary: Negative.   Musculoskeletal: Positive for back pain.  Neurological: Positive for headaches. Negative for speech difficulty and light-headedness.  Psychiatric/Behavioral: Negative.       Objective:    Physical Exam  Constitutional: She is oriented to person, place, and time. She appears well-developed and well-nourished. No distress.  HENT:  Head: Normocephalic.  Right Ear: External ear normal.  Left Ear: External ear normal.  Eyes: Conjunctivae and EOM are normal. Right eye exhibits no discharge. Left eye exhibits no discharge. No scleral icterus.  Neck: Neck supple. No JVD present. No tracheal deviation present.  Pulmonary/Chest: Effort normal. No stridor.  Neurological: She is alert and oriented to person, place, and time.  Skin: She is not diaphoretic.  Psychiatric: She has a normal  mood and affect. Her behavior is normal.    LMP 08/16/2011  Wt Readings from Last 3 Encounters:  01/29/18 227 lb 8 oz (103.2 kg)  07/30/17 215 lb 6 oz (97.7 kg)  12/27/15 244 lb (110.7 kg)   BP Readings from Last 3 Encounters:  01/29/18 124/78  07/30/17 120/78  12/27/15 122/88   Guideline developer:  UpToDate (see UpToDate for funding source) Date Released: June 2014  Health Maintenance Due  Topic Date Due  . Hepatitis C Screening  January 04, 1961  . COLONOSCOPY  01/29/2018  . MAMMOGRAM  04/24/2018  . INFLUENZA VACCINE  09/28/2018    There  are no preventive care reminders to display for this patient.  Lab Results  Component Value Date   TSH 0.21 (L) 01/29/2018   Lab Results  Component Value Date   WBC 5.2 01/29/2018   HGB 14.3 01/29/2018   HCT 42.7 01/29/2018   MCV 89.4 01/29/2018   PLT 294.0 01/29/2018   Lab Results  Component Value Date   NA 138 01/29/2018   K 4.4 01/29/2018   CO2 25 01/29/2018   GLUCOSE 103 (H) 01/29/2018   BUN 17 01/29/2018   CREATININE 0.84 01/29/2018   BILITOT 0.4 01/29/2018   ALKPHOS 86 01/29/2018   AST 12 01/29/2018   ALT 13 01/29/2018   PROT 6.4 01/29/2018   ALBUMIN 4.1 01/29/2018   CALCIUM 9.0 01/29/2018   ANIONGAP 10 06/30/2015   GFR 74.01 01/29/2018   Lab Results  Component Value Date   CHOL 247 (H) 01/29/2018   Lab Results  Component Value Date   HDL 51.00 01/29/2018   Lab Results  Component Value Date   LDLCALC 175 04/05/2017   Lab Results  Component Value Date   TRIG 244.0 (H) 01/29/2018   Lab Results  Component Value Date   CHOLHDL 5 01/29/2018   Lab Results  Component Value Date   HGBA1C 5.2 04/05/2017      Assessment & Plan:   Problem List Items Addressed This Visit      Digestive   GERD (gastroesophageal reflux disease)   Relevant Medications   omeprazole (PRILOSEC) 40 MG capsule    Other Visit Diagnoses    Acute non-recurrent maxillary sinusitis    -  Primary   Relevant Medications    clarithromycin (BIAXIN XL) 500 MG 24 hr tablet      Meds ordered this encounter  Medications  . clarithromycin (BIAXIN XL) 500 MG 24 hr tablet    Sig: Take 2 tablets (1,000 mg total) by mouth daily for 10 days.    Dispense:  20 tablet    Refill:  0    Hold pravachol while taking  . omeprazole (PRILOSEC) 40 MG capsule    Sig: TAKE 1 CAPSULE (40 MG) BY MOUTH ONE TIME DAILIY    Dispense:  90 capsule    Refill:  0    Follow-up: Return in about 1 week (around 02/05/2019), or if symptoms worsen or fail to improve.    Virtual Visit via Video Note  I connected with Nancy Burch on 01/29/19 at 10:30 AM EST by a video enabled telemedicine application and verified that I am speaking with the correct person using two identifiers.  Location: Patient: home alone Provider:    I discussed the limitations of evaluation and management by telemedicine and the availability of in person appointments. The patient expressed understanding and agreed to proceed.  History of Present Illness:    Observations/Objective:   Assessment and Plan:   Follow Up Instructions:    I discussed the assessment and treatment plan with the patient. The patient was provided an opportunity to ask questions and all were answered. The patient agreed with the plan and demonstrated an understanding of the instructions.   The patient was advised to call back or seek an in-person evaluation if the symptoms worsen or if the condition fails to improve as anticipated.  I provided 20 theminutes of non-face-to-face time during this encounter.   Libby Maw, MD

## 2019-02-01 ENCOUNTER — Other Ambulatory Visit: Payer: Self-pay | Admitting: Family Medicine

## 2019-02-04 ENCOUNTER — Telehealth: Payer: Self-pay | Admitting: Family Medicine

## 2019-02-04 NOTE — Telephone Encounter (Signed)
Received Medical clearance from [facility] specialist for surgery for pt scheduled on 02/18/19. Per Dr. Ethelene Hal pt is come in for a TSH. Called pt and she scheduled her lab visit for tomorrow morning. Screening done.

## 2019-02-05 ENCOUNTER — Other Ambulatory Visit (INDEPENDENT_AMBULATORY_CARE_PROVIDER_SITE_OTHER): Payer: 59

## 2019-02-05 ENCOUNTER — Other Ambulatory Visit: Payer: Self-pay

## 2019-02-05 DIAGNOSIS — E063 Autoimmune thyroiditis: Secondary | ICD-10-CM | POA: Diagnosis not present

## 2019-02-05 DIAGNOSIS — E038 Other specified hypothyroidism: Secondary | ICD-10-CM

## 2019-02-05 LAB — TSH: TSH: 5.16 u[IU]/mL — ABNORMAL HIGH (ref 0.35–4.50)

## 2019-02-05 NOTE — Addendum Note (Signed)
Addended by: Lynnea Ferrier on: 02/05/2019 10:46 AM   Modules accepted: Orders

## 2019-02-10 NOTE — Telephone Encounter (Signed)
Form was completed by Dr. Ethelene Hal. This was faxed

## 2019-02-12 ENCOUNTER — Telehealth: Payer: Self-pay

## 2019-02-12 NOTE — Telephone Encounter (Signed)
Copied from [place] [id number]. Topic: Referral - Request for Referral >> Feb 12, 2019  8:54 AM Reyne Dumas L wrote: Has patient seen PCP for this complaint? no *If NO, is insurance requiring patient see PCP for this issue before PCP can refer them? Referral for which specialty: cardiologist Preferred provider/office: no preference Reason for referral: EKG was abnormal at pre-op (surgery for back fusion) and she must see a cardiologist before she can continue with surgery.  Pt can be reached at [phone]

## 2019-02-12 NOTE — Telephone Encounter (Signed)
Needs to see me. Her hypothyroidism is not well controlled either.

## 2019-02-12 NOTE — Telephone Encounter (Signed)
Spoke with pt and she agreed to [place]. This was scheduled for tomorrow morning.

## 2019-02-13 ENCOUNTER — Ambulatory Visit: Payer: 59 | Admitting: Family Medicine

## 2019-02-14 ENCOUNTER — Other Ambulatory Visit: Payer: Self-pay | Admitting: Family Medicine

## 2019-02-14 ENCOUNTER — Other Ambulatory Visit: Payer: Self-pay

## 2019-02-14 ENCOUNTER — Encounter: Payer: Self-pay | Admitting: Cardiology

## 2019-02-14 ENCOUNTER — Ambulatory Visit (INDEPENDENT_AMBULATORY_CARE_PROVIDER_SITE_OTHER): Payer: 59 | Admitting: Cardiology

## 2019-02-14 VITALS — BP 112/64 | HR 84 | Ht 64.0 in | Wt 253.0 lb

## 2019-02-14 DIAGNOSIS — K219 Gastro-esophageal reflux disease without esophagitis: Secondary | ICD-10-CM

## 2019-02-14 DIAGNOSIS — E785 Hyperlipidemia, unspecified: Secondary | ICD-10-CM | POA: Diagnosis not present

## 2019-02-14 DIAGNOSIS — Z87891 Personal history of nicotine dependence: Secondary | ICD-10-CM | POA: Diagnosis not present

## 2019-02-14 DIAGNOSIS — E782 Mixed hyperlipidemia: Secondary | ICD-10-CM

## 2019-02-14 DIAGNOSIS — R9431 Abnormal electrocardiogram [ECG] [EKG]: Secondary | ICD-10-CM | POA: Diagnosis not present

## 2019-02-14 NOTE — Patient Instructions (Signed)
Medication Instructions:  Your physician recommends that you continue on your current medications as directed. Please refer to the Current Medication list given to you today.  *If you need a refill on your cardiac medications before your next appointment, please call your pharmacy*  Lab Work: None.  If you have labs (blood work) drawn today and your tests are completely normal, you will receive your results only by: Marland Kitchen MyChart Message (if you have MyChart) OR . A paper copy in the mail If you have any lab test that is abnormal or we need to change your treatment, we will call you to review the results.  Testing/Procedures: Your physician has requested that you have an echocardiogram. Echocardiography is a painless test that uses sound waves to create images of your heart. It provides your doctor with information about the size and shape of your heart and how well your heart's chambers and valves are working. This procedure takes approximately one hour. There are no restrictions for this procedure.  Your physician has requested that you have a lexiscan myoview. For further information please visit [email or web address]. Please follow instruction sheet, as given.  [facility] [address], [place], [place] [address] [phone]  Lexiscan testing instructions:  Please present to [facility] 15 minutes earlier than your appointment time to allow for registration.  You will be called with an appointment date and time by our office once it has been scheduled; please allow at least 48 hours for [place] to contact you.  No food or drink after midnight prior to your test (except for small sips of water with your medications).   Bring a medication list or all your medications with you the morning of the testing.  No caffeine, decaffeinated or chocolate products 12 hours prior to the testing.  Please be aware that the test can take up to 3-4 hours. This is a 2 day test  process and you will follow the same instructions for both days.   Should you have any problem with the appointment date or time, please call [phone].   Please call the office with any further questions or concerns.    Follow-Up: At [facility], you and your health needs are our priority.  As part of our continuing mission to provide you with exceptional heart care, we have created designated Provider Care Teams.  These Care Teams include your primary Cardiologist (physician) and Advanced Practice Providers (APPs -  Physician Assistants and Nurse Practitioners) who all work together to provide you with the care you need, when you need it.  Your next appointment:   3 month(s)  The format for your next appointment:   In Person  Provider:   Jenne Campus, MD  Other Instructions    Echocardiogram An echocardiogram is a procedure that uses painless sound waves (ultrasound) to produce an image of the heart. Images from an echocardiogram can provide important information about:  Signs of coronary artery disease (CAD).  Aneurysm detection. An aneurysm is a weak or damaged part of an artery wall that bulges out from the normal force of blood pumping through the body.  Heart size and shape. Changes in the size or shape of the heart can be associated with certain conditions, including heart failure, aneurysm, and CAD.  Heart muscle function.  Heart valve function.  Signs of a past heart attack.  Fluid buildup around the heart.  Thickening of the heart muscle.  A tumor or infectious growth around the heart valves.  Tell a health care provider about:  Any allergies you have.  All medicines you are taking, including vitamins, herbs, eye drops, creams, and over-the-counter medicines.  Any blood disorders you have.  Any surgeries you have had.  Any medical conditions you have.  Whether you are pregnant or may be pregnant. What are the risks? Generally, this is a  safe procedure. However, problems may occur, including:  Allergic reaction to dye (contrast) that may be used during the procedure. What happens before the procedure? No specific preparation is needed. You may eat and drink normally. What happens during the procedure?   An IV tube may be inserted into one of your veins.  You may receive contrast through this tube. A contrast is an injection that improves the quality of the pictures from your heart.  A gel will be applied to your chest.  A wand-like tool (transducer) will be moved over your chest. The gel will help to transmit the sound waves from the transducer.  The sound waves will harmlessly bounce off of your heart to allow the heart images to be captured in real-time motion. The images will be recorded on a computer. The procedure may vary among health care providers and hospitals. What happens after the procedure?  You may return to your normal, everyday life, including diet, activities, and medicines, unless your health care provider tells you not to do that. Summary  An echocardiogram is a procedure that uses painless sound waves (ultrasound) to produce an image of the heart.  Images from an echocardiogram can provide important information about the size and shape of your heart, heart muscle function, heart valve function, and fluid buildup around your heart.  You do not need to do anything to prepare before this procedure. You may eat and drink normally.  After the echocardiogram is completed, you may return to your normal, everyday life, unless your health care provider tells you not to do that. This information is not intended to replace advice given to you by your health care provider. Make sure you discuss any questions you have with your health care provider. Document Released: 02/11/2000 Document Revised: 06/06/2018 Document Reviewed: 03/18/2016 Elsevier Patient Education  2020 [employer].   Cardiac Nuclear  Scan A cardiac nuclear scan is a test that measures blood flow to the heart when a person is resting and when he or she is exercising. The test looks for problems such as:  Not enough blood reaching a portion of the heart.  The heart muscle not working normally. You may need this test if:  You have heart disease.  You have had abnormal lab results.  You have had heart surgery or a balloon procedure to open up blocked arteries (angioplasty).  You have chest pain.  You have shortness of breath. In this test, a radioactive dye (tracer) is injected into your bloodstream. After the tracer has traveled to your heart, an imaging device is used to measure how much of the tracer is absorbed by or distributed to various areas of your heart. This procedure is usually done at a hospital and takes 2-4 hours. Tell a health care provider about:  Any allergies you have.  All medicines you are taking, including vitamins, herbs, eye drops, creams, and over-the-counter medicines.  Any problems you or family members have had with anesthetic medicines.  Any blood disorders you have.  Any surgeries you have had.  Any medical conditions you have.  Whether you are pregnant or may be pregnant.  What are the risks? Generally, this is a safe procedure. However, problems may occur, including:  Serious chest pain and heart attack. This is only a risk if the stress portion of the test is done.  Rapid heartbeat.  Sensation of warmth in your chest. This usually passes quickly.  Allergic reaction to the tracer. What happens before the procedure?  Ask your health care provider about changing or stopping your regular medicines. This is especially important if you are taking diabetes medicines or blood thinners.  Follow instructions from your health care provider about eating or drinking restrictions.  Remove your jewelry on the day of the procedure. What happens during the procedure?  An IV will be  inserted into one of your veins.  Your health care provider will inject a small amount of radioactive tracer through the IV.  You will wait for 20-40 minutes while the tracer travels through your bloodstream.  Your heart activity will be monitored with an electrocardiogram (ECG).  You will lie down on an exam table.  Images of your heart will be taken for about 15-20 minutes.  You may also have a stress test. For this test, one of the following may be done: ? You will exercise on a treadmill or stationary bike. While you exercise, your heart's activity will be monitored with an ECG, and your blood pressure will be checked. ? You will be given medicines that will increase blood flow to parts of your heart. This is done if you are unable to exercise.  When blood flow to your heart has peaked, a tracer will again be injected through the IV.  After 20-40 minutes, you will get back on the exam table and have more images taken of your heart.  Depending on the type of tracer used, scans may need to be repeated 3-4 hours later.  Your IV line will be removed when the procedure is over. The procedure may vary among health care providers and hospitals. What happens after the procedure?  Unless your health care provider tells you otherwise, you may return to your normal schedule, including diet, activities, and medicines.  Unless your health care provider tells you otherwise, you may increase your fluid intake. This will help to flush the contrast dye from your body. Drink enough fluid to keep your urine pale yellow.  Ask your health care provider, or the department that is doing the test: ? When will my results be ready? ? How will I get my results? Summary  A cardiac nuclear scan measures the blood flow to the heart when a person is resting and when he or she is exercising.  Tell your health care provider if you are pregnant.  Before the procedure, ask your health care provider about  changing or stopping your regular medicines. This is especially important if you are taking diabetes medicines or blood thinners.  After the procedure, unless your health care provider tells you otherwise, increase your fluid intake. This will help flush the contrast dye from your body.  After the procedure, unless your health care provider tells you otherwise, you may return to your normal schedule, including diet, activities, and medicines. This information is not intended to replace advice given to you by your health care provider. Make sure you discuss any questions you have with your health care provider. Document Released: 03/10/2004 Document Revised: 07/30/2017 Document Reviewed: 07/30/2017 Elsevier Patient Education  2020 [employer].

## 2019-02-14 NOTE — Addendum Note (Signed)
Addended by: Ashok Norris on: 02/14/2019 09:37 AM   Modules accepted: Orders

## 2019-02-14 NOTE — Progress Notes (Signed)
Cardiology Consultation:    Date:  02/14/2019   ID:  Nancy Burch, DOB [birth year], MRN [id number]  PCP:  Nancy Maw, MD  Cardiologist:  Jenne Campus, MD   Referring MD: Nancy Burch,*   Chief Complaint  Patient presents with  . Pre-op Exam    Spinal fusion, Spine and Scoliosis    History of Present Illness:    Nancy Burch is a 58 y.o. female who is being seen today for the evaluation of need back surgery at the request of Nancy Burch,*.  She does have a past medical history significant for incomplete right bundle branch block on EKG, prior work-up done in 2017 included echocardiogram which showed preserved left ventricle ejection fraction.  That was done before her knee surgery now she required quite extensive and lengthy surgery for her back I would consider this surgery as moderate risk.  Her risk factors for coronary artery disease include history of smoking also has dyslipidemia.  She does not have hypertension he does not have family history of premature coronary artery disease.  Obviously her exercise tolerance is limited because of chronic back problem.  She says she can walk for about 10 minutes and that she have to slow down because of chronic back pain she also have difficulty climbing stairs so based on her ability to exercise we cannot assess her risk factors.  We had a long discussion about what to do with the situation my opinion the most prudent approach will be to perform stress test as well as echocardiogram.  Past Medical History:  Diagnosis Date  . Arthritis   . Asthma   . Depression   . GERD (gastroesophageal reflux disease)   . 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 Hashimoto's thyroiditis 06/16/2015  . Hypothyroidism   . Primary localized osteoarthritis of right knee 06/16/2015  . Pure hypercholesterolemia 06/16/2015    Past Surgical History:  Procedure Laterality Date  .  BIOPSY THYROID  01/28/2013  . CARPOMETACARPAL (CMC) FUSION OF THUMB    . [place]  . TOTAL KNEE ARTHROPLASTY Right 06/28/2015   Procedure: TOTAL KNEE ARTHROPLASTY;  Surgeon: Elsie Saas, MD;  Location: [place];  Service: Orthopedics;  Laterality: Right;  . WISDOM TOOTH EXTRACTION      Current Medications: Current Meds  Medication Sig  . albuterol (PROVENTIL HFA;VENTOLIN HFA) 108 (90 Base) MCG/ACT inhaler Inhale 2 puffs into the lungs every 6 (six) hours as needed for shortness of breath.  . DULoxetine (CYMBALTA) 60 MG capsule Take 120 mg by mouth daily.   . Estradiol-Norethindrone Acet 0.5-0.1 MG tablet Take 1 tablet by mouth daily.  . famotidine (PEPCID) 40 MG tablet TAKE 1 TABLET (40 MG) BY MOUTH DAILY  . fexofenadine (ALLEGRA) 180 MG tablet Take 180 mg by mouth daily.  Marland Kitchen levothyroxine (SYNTHROID, LEVOTHROID) 137 MCG tablet Take 1 tablet (137 mcg total) by mouth daily before breakfast. On a fasting stomach one hour prior to eating.  Marland Kitchen omeprazole (PRILOSEC) 40 MG capsule TAKE 1 CAPSULE (40 MG) BY MOUTH ONE TIME DAILIY  . pravastatin (PRAVACHOL) 20 MG tablet TAKE 1 TABLET BY MOUTH EVERYDAY AT BEDTIME  . tiZANidine (ZANAFLEX) 4 MG tablet TAKE 1 TABLET BY MOUTH EVERY 6-8 HOURS AS NEEDED FOR MUSCLE SPASMS     Allergies:   Penicillins   Social History   Socioeconomic History  . Marital status: Married    Spouse name: Johnnette Litter  .  Number of children: Not on file  . Years of education: Not on file  . Highest education level: Not on file  Occupational History  . Occupation: controller  Tobacco Use  . Smoking status: Former Smoker    Quit date: 06/27/2005    Years since quitting: 13.6  . Smokeless tobacco: Never Used  Substance and Sexual Activity  . Alcohol use: Yes    Alcohol/week: 2.0 standard drinks    Types: 2 Glasses of wine per week    Comment: 2 galsses per month  . Drug use: No  . Sexual activity: Yes  Other Topics Concern  . Not on file  Social  History Narrative  . Not on file   Social Determinants of Health   Financial Resource Strain:   . Difficulty of Paying Living Expenses: Not on file  Food Insecurity:   . Worried About Charity fundraiser in the Last Year: Not on file  . Ran Out of Food in the Last Year: Not on file  Transportation Needs:   . Lack of Transportation (Medical): Not on file  . Lack of Transportation (Non-Medical): Not on file  Physical Activity:   . Days of Exercise per Week: Not on file  . Minutes of Exercise per Session: Not on file  Stress:   . Feeling of Stress : Not on file  Social Connections:   . Frequency of Communication with Friends and Family: Not on file  . Frequency of Social Gatherings with Friends and Family: Not on file  . Attends Religious Services: Not on file  . Active Member of Clubs or Organizations: Not on file  . Attends Archivist Meetings: Not on file  . Marital Status: Not on file     Family History: The patient's family history includes Breast cancer in her mother; Colon cancer (age of onset: 39) in her sister; Diabetes in her sister, sister, sister, and sister; Melanoma in her father; Thyroid disease in her mother and sister. There is no history of Stomach cancer. ROS:   Please see the history of present illness.    All 14 point review of systems negative except as described per history of present illness.  EKGs/Labs/Other Studies Reviewed:    The following studies were reviewed today:   EKG:  EKG is  ordered today.  The ekg ordered today demonstrates normal sinus rhythm normal P interval incomplete right bundle branch block, possibility of anterior infarct.   Recent Labs: 02/05/2019: TSH 5.16  Recent Lipid Panel    Component Value Date/Time   CHOL 247 (H) 01/29/2018 0832   TRIG 244.0 (H) 01/29/2018 0832   HDL 51.00 01/29/2018 0832   CHOLHDL 5 01/29/2018 0832   VLDL 48.8 (H) 01/29/2018 0832   LDLCALC 175 04/05/2017 0000   LDLDIRECT 153.0 01/29/2018  0832    Physical Exam:    VS:  BP 112/64   Pulse 84   Ht 5\' 4"  (1.626 m)   Wt 253 lb (114.8 kg)   LMP 08/16/2011   SpO2 96%   BMI 43.43 kg/m     Wt Readings from Last 3 Encounters:  02/14/19 253 lb (114.8 kg)  01/29/18 227 lb 8 oz (103.2 kg)  07/30/17 215 lb 6 oz (97.7 kg)     GEN:  Well nourished, well developed in no acute distress HEENT: Normal NECK: No JVD; No carotid bruits LYMPHATICS: No lymphadenopathy CARDIAC: RRR, no murmurs, no rubs, no gallops RESPIRATORY:  Clear to auscultation without rales, wheezing or  rhonchi  ABDOMEN: Soft, non-tender, non-distended MUSCULOSKELETAL:  No edema; No deformity  SKIN: Warm and dry NEUROLOGIC:  Alert and oriented x 3 PSYCHIATRIC:  Normal affect   ASSESSMENT:    1. Gastroesophageal reflux disease, unspecified whether esophagitis present   2. Nonspecific abnormal electrocardiogram (ECG) (EKG)   3. Dyslipidemia   4. History of smoking    PLAN:    In order of problems listed above:  1. Preop evaluation for quite extensive back surgery under general anesthesia.  In my opinion she need to have a stress test was echocardiogram.  Her EKG showing possibility of old anterior wall myocardial infarction.  She had similar EKG in 2017 with echocardiogram being normal at the time therefore I have a lot of low level of suspicion there were discover something dramatic but that need to be clarified.  Because of her surgery being on Tuesday we will try to do all this test on Monday if those tests are negative then will be able to proceed with surgery as scheduled. 2. Dyslipidemia her last fasting lipid profile is quite high still.  In my opinion we need to augment her cholesterol-lowering therapy. 3. History of smoking she is abstaining from it.   Medication Adjustments/Labs and Tests Ordered: Current medicines are reviewed at length with the patient today.  Concerns regarding medicines are outlined above.  No orders of the defined types were  placed in this encounter.  No orders of the defined types were placed in this encounter.   Signed, Park Liter, MD, [facility] 02/14/2019 9:15 AM    [place]

## 2019-02-14 NOTE — Addendum Note (Signed)
Addended by: Linna Hoff R on: 02/14/2019 11:50 AM   Modules accepted: Orders

## 2019-02-18 DIAGNOSIS — R9431 Abnormal electrocardiogram [ECG] [EKG]: Secondary | ICD-10-CM

## 2019-02-18 DIAGNOSIS — R079 Chest pain, unspecified: Secondary | ICD-10-CM

## 2019-02-19 ENCOUNTER — Other Ambulatory Visit: Payer: Self-pay | Admitting: Family Medicine

## 2019-02-24 ENCOUNTER — Telehealth: Payer: Self-pay

## 2019-02-24 ENCOUNTER — Telehealth: Payer: Self-pay | Admitting: Cardiology

## 2019-02-24 NOTE — Telephone Encounter (Signed)
New Message  Pt is calling and wants to know her results from 02/14/19.  Please call to discuss

## 2019-02-24 NOTE — Telephone Encounter (Addendum)
  Per Dr. Agustin Cree it is ok to proceed with surgery.  Requesting office has been notified by phone but will send through efax.           [facility] Pre-operative Risk Assessment    Request for surgical clearance:  1. What type of surgery is being performed? L3-F1 gillamonectomy and fusion with TLIF  2. When is this surgery scheduled? 02/26/2019   3. What type of clearance is required (medical clearance vs. Pharmacy clearance to hold med vs. Both)? cardio  4. Are there any medications that need to be held prior to surgery and how long? NO   5. Practice name and name of physician performing surgery? [facility]   6. What is your office phone number [phone]    1.   What is your office fax number [phone]  8.   Anesthesia type (None, local, MAC, general) ? GENERAL

## 2019-02-24 NOTE — Telephone Encounter (Signed)
Deborah at the spine clinic called requesting surgery clearance for this pt. Her primary cardiologist is Dr. Agustin Cree. Neoma Laming can be reached at [phone] ext [phone] fax [phone]. Please address thank you.

## 2019-02-25 NOTE — Telephone Encounter (Signed)
Called patient. Informed her per Dr. Agustin Cree she has been cleared for surgery and the letter was faxed yesterday. We have called and left a message with Neoma Laming to confirm the fax was received. Informed patient of this and to let us know if they didn't get it. No further questions.

## 2019-02-25 NOTE — Telephone Encounter (Signed)
Follow Up:     Pt says she needs her results for her test. She have to have this ior before 10:00 today, if not her surgery will be canceled.

## 2019-02-25 NOTE — Telephone Encounter (Signed)
Please see additional encounter.

## 2019-02-26 DIAGNOSIS — Q762 Congenital spondylolisthesis: Secondary | ICD-10-CM | POA: Insufficient documentation

## 2019-02-26 NOTE — Telephone Encounter (Signed)
See telephone encounter.  Clearance faxed.  Loel Dubonnet, NP

## 2019-03-13 ENCOUNTER — Telehealth: Payer: Self-pay | Admitting: Emergency Medicine

## 2019-03-13 NOTE — Telephone Encounter (Signed)
Called patient informed her that her stress test was negative per Dr. Agustin Cree during the call we also scheduled her 3 month follow up appointment. No further questions.

## 2019-04-11 ENCOUNTER — Other Ambulatory Visit: Payer: Self-pay | Admitting: Family Medicine

## 2019-04-11 ENCOUNTER — Encounter: Payer: Self-pay | Admitting: Family Medicine

## 2019-04-11 DIAGNOSIS — E038 Other specified hypothyroidism: Secondary | ICD-10-CM

## 2019-04-11 DIAGNOSIS — E063 Autoimmune thyroiditis: Secondary | ICD-10-CM

## 2019-04-11 DIAGNOSIS — K219 Gastro-esophageal reflux disease without esophagitis: Secondary | ICD-10-CM

## 2019-04-11 MED ORDER — LEVOTHYROXINE SODIUM 137 MCG PO TABS: 137.0000 ug | ORAL_TABLET | Freq: Every day | ORAL | 0 refills | Status: DC

## 2019-04-15 ENCOUNTER — Telehealth (INDEPENDENT_AMBULATORY_CARE_PROVIDER_SITE_OTHER): Payer: 59 | Admitting: Family Medicine

## 2019-04-15 ENCOUNTER — Encounter: Payer: Self-pay | Admitting: Family Medicine

## 2019-04-15 VITALS — Ht 64.0 in | Wt 245.0 lb

## 2019-04-15 DIAGNOSIS — E063 Autoimmune thyroiditis: Secondary | ICD-10-CM | POA: Diagnosis not present

## 2019-04-15 DIAGNOSIS — E038 Other specified hypothyroidism: Secondary | ICD-10-CM

## 2019-04-15 MED ORDER — LEVOTHYROXINE SODIUM 150 MCG PO TABS: 150.0000 ug | ORAL_TABLET | Freq: Every day | ORAL | 0 refills | Status: DC

## 2019-04-15 NOTE — Progress Notes (Signed)
Established Patient Office Visit  Subjective:  Patient ID: Nancy Burch, female    DOB: 03-Feb-1961  Age: 59 y.o. MRN: [id number]  CC:  Chief Complaint  Patient presents with  . Follow-up    refill/follow up on Synthroid, no concerns at this time.     HPI [name] presents for follow-up of her hypothyroidism.  She has been taking the 137 mcg dose of levothyroxine daily 1 hour before eating in the morning on a fasting stomach she assures me.  Continues to take pravastatin for her cholesterol.  She is continue Cymbalta as she says.  Thanks she is recovering from her back surgery and back at work.  Back is much improved.  She is not interested in having the Covid vaccine because she feels as though she actually had the illness.  She has ongoing hair loss that is not new.  There is some cold sensitivity.  There is some constipation.  Past Medical History:  Diagnosis Date  . Arthritis   . Asthma   . Depression   . GERD (gastroesophageal reflux disease)   . 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 Hashimoto's thyroiditis 06/16/2015  . Hypothyroidism   . Primary localized osteoarthritis of right knee 06/16/2015  . Pure hypercholesterolemia 06/16/2015    Past Surgical History:  Procedure Laterality Date  . BIOPSY THYROID  01/28/2013  . CARPOMETACARPAL (CMC) FUSION OF THUMB    . [place]  . TOTAL KNEE ARTHROPLASTY Right 06/28/2015   Procedure: TOTAL KNEE ARTHROPLASTY;  Surgeon: Elsie Saas, MD;  Location: [place];  Service: Orthopedics;  Laterality: Right;  . WISDOM TOOTH EXTRACTION      Family History  Problem Relation Age of Onset  . Diabetes Sister   . Colon cancer Sister 51  . Thyroid disease Sister   . Breast cancer Mother   . Thyroid disease Mother   . Melanoma Father   . Diabetes Sister   . Diabetes Sister   . Diabetes Sister   . Stomach cancer Neg Hx     Social History   Socioeconomic History  .  Marital status: Married    Spouse name: Johnnette Litter  . Number of children: Not on file  . Years of education: Not on file  . Highest education level: Not on file  Occupational History  . Occupation: controller  Tobacco Use  . Smoking status: Former Smoker    Quit date: 06/27/2005    Years since quitting: 13.8  . Smokeless tobacco: Never Used  Substance and Sexual Activity  . Alcohol use: Yes    Alcohol/week: 2.0 standard drinks    Types: 2 Glasses of wine per week    Comment: 2 galsses per month  . Drug use: No  . Sexual activity: Yes  Other Topics Concern  . Not on file  Social History Narrative  . Not on file   Social Determinants of Health   Financial Resource Strain:   . Difficulty of Paying Living Expenses: Not on file  Food Insecurity:   . Worried About Charity fundraiser in the Last Year: Not on file  . Ran Out of Food in the Last Year: Not on file  Transportation Needs:   . Lack of Transportation (Medical): Not on file  . Lack of Transportation (Non-Medical): Not on file  Physical Activity:   . Days of Exercise per Week: Not on file  . Minutes of Exercise per  Session: Not on file  Stress:   . Feeling of Stress : Not on file  Social Connections:   . Frequency of Communication with Friends and Family: Not on file  . Frequency of Social Gatherings with Friends and Family: Not on file  . Attends Religious Services: Not on file  . Active Member of Clubs or Organizations: Not on file  . Attends Archivist Meetings: Not on file  . Marital Status: Not on file  Intimate Partner Violence:   . Fear of Current or Ex-Partner: Not on file  . Emotionally Abused: Not on file  . Physically Abused: Not on file  . Sexually Abused: Not on file    Outpatient Medications Prior to Visit  Medication Sig Dispense Refill  . albuterol (PROVENTIL HFA;VENTOLIN HFA) 108 (90 Base) MCG/ACT inhaler Inhale 2 puffs into the lungs every 6 (six) hours as needed for shortness of  breath. 1 Inhaler 0  . DULoxetine (CYMBALTA) 60 MG capsule Take 120 mg by mouth daily.     . Estradiol-Norethindrone Acet 0.5-0.1 MG tablet Take 1 tablet by mouth daily.    . famotidine (PEPCID) 40 MG tablet TAKE 1 TABLET (40 MG) BY MOUTH DAILY 90 tablet 0  . fexofenadine (ALLEGRA) 180 MG tablet Take 180 mg by mouth daily.    Marland Kitchen omeprazole (PRILOSEC) 40 MG capsule TAKE 1 CAPSULE (40 MG) BY MOUTH ONE TIME DAILY 90 capsule 0  . pravastatin (PRAVACHOL) 20 MG tablet TAKE 1 TABLET BY MOUTH EVERYDAY AT BEDTIME 90 tablet 0  . tiZANidine (ZANAFLEX) 4 MG tablet TAKE 1 TABLET BY MOUTH EVERY 6-8 HOURS AS NEEDED FOR MUSCLE SPASMS 60 tablet 1  . levothyroxine (SYNTHROID) 137 MCG tablet Take 1 tablet (137 mcg total) by mouth daily before breakfast. On a fasting stomach one hour prior to eating. 30 tablet 0   No facility-administered medications prior to visit.    Allergies  Allergen Reactions  . Penicillins Hives    ROS Review of Systems  Constitutional: Negative.   HENT: Negative.   Eyes: Negative for photophobia and visual disturbance.  Respiratory: Negative.   Cardiovascular: Negative.   Gastrointestinal: Negative.   Endocrine: Positive for cold intolerance. Negative for heat intolerance.  Genitourinary: Negative.   Musculoskeletal: Negative for gait problem and joint swelling.  Skin: Negative for pallor.  Allergic/Immunologic: Negative for immunocompromised state.  Neurological: Negative for tremors and speech difficulty.  Hematological: Does not bruise/bleed easily.  Psychiatric/Behavioral: Negative.       Objective:    Physical Exam  Constitutional: She is oriented to person, place, and time. She appears well-developed and well-nourished. No distress.  HENT:  Head: Normocephalic and atraumatic.  Right Ear: External ear normal.  Left Ear: External ear normal.  Eyes: Conjunctivae are normal. Right eye exhibits no discharge. Left eye exhibits no discharge. No scleral icterus.  Neck:  No JVD present. No tracheal deviation present.  Pulmonary/Chest: Effort normal. No stridor.  Neurological: She is alert and oriented to person, place, and time.  Skin: She is not diaphoretic.  Psychiatric: She has a normal mood and affect. Her behavior is normal.    Ht 5\' 4"  (1.626 m)   Wt 245 lb (111.1 kg) Comment: per pt  LMP 08/16/2011   BMI 42.05 kg/m  Wt Readings from Last 3 Encounters:  04/15/19 245 lb (111.1 kg)  02/14/19 253 lb (114.8 kg)  01/29/18 227 lb 8 oz (103.2 kg)     Health Maintenance Due  Topic Date Due  .  Hepatitis C Screening  05/08/1960  . COLONOSCOPY  01/29/2018  . MAMMOGRAM  04/24/2018  . INFLUENZA VACCINE  09/28/2018    There are no preventive care reminders to display for this patient.  Lab Results  Component Value Date   TSH 5.16 (H) 02/05/2019   Lab Results  Component Value Date   WBC 5.2 01/29/2018   HGB 14.3 01/29/2018   HCT 42.7 01/29/2018   MCV 89.4 01/29/2018   PLT 294.0 01/29/2018   Lab Results  Component Value Date   NA 138 01/29/2018   K 4.4 01/29/2018   CO2 25 01/29/2018   GLUCOSE 103 (H) 01/29/2018   BUN 17 01/29/2018   CREATININE 0.84 01/29/2018   BILITOT 0.4 01/29/2018   ALKPHOS 86 01/29/2018   AST 12 01/29/2018   ALT 13 01/29/2018   PROT 6.4 01/29/2018   ALBUMIN 4.1 01/29/2018   CALCIUM 9.0 01/29/2018   ANIONGAP 10 06/30/2015   GFR 74.01 01/29/2018   Lab Results  Component Value Date   CHOL 247 (H) 01/29/2018   Lab Results  Component Value Date   HDL 51.00 01/29/2018   Lab Results  Component Value Date   LDLCALC 175 04/05/2017   Lab Results  Component Value Date   TRIG 244.0 (H) 01/29/2018   Lab Results  Component Value Date   CHOLHDL 5 01/29/2018   Lab Results  Component Value Date   HGBA1C 5.2 04/05/2017      Assessment & Plan:   Problem List Items Addressed This Visit      Endocrine   Hypothyroidism due to Hashimoto's thyroiditis - Primary   Relevant Medications   levothyroxine  (SYNTHROID) 150 MCG tablet      Meds ordered this encounter  Medications  . levothyroxine (SYNTHROID) 150 MCG tablet    Sig: Take 1 tablet (150 mcg total) by mouth daily.    Dispense:  90 tablet    Refill:  0    Follow-up: Return in about 3 months (around 07/13/2019), or face to face visit..  Have increased her levothyroxine to 150 mcg.  She will follow-up fasting in 3 months for face-to-face visit.  Advised her to have the Covid vaccine when it becomes available to her and at this point in time she declines.  Libby Maw, MD   Virtual Visit via Video Note  I connected with [name] on 04/15/19 at  2:00 PM EST by a video enabled telemedicine application and verified that I am speaking with the correct person using two identifiers.  Location: Patient: in her office at work alone.  Provider:    I discussed the limitations of evaluation and management by telemedicine and the availability of in person appointments. The patient expressed understanding and agreed to proceed.  History of Present Illness:    Observations/Objective:   Assessment and Plan:   Follow Up Instructions:    I discussed the assessment and treatment plan with the patient. The patient was provided an opportunity to ask questions and all were answered. The patient agreed with the plan and demonstrated an understanding of the instructions.   The patient was advised to call back or seek an in-person evaluation if the symptoms worsen or if the condition fails to improve as anticipated.  I provided 20 minutes of non-face-to-face time during this encounter.   Libby Maw, MD

## 2019-04-16 NOTE — Telephone Encounter (Signed)
Called patient to schedule an appointment to come in for labs for TSH, no answer LM asking patient to call the office for an appointment for labs for Dr. Ethelene Hal to be able to refill Leothyroxine 137MCG

## 2019-05-02 ENCOUNTER — Encounter: Payer: Self-pay | Admitting: Gastroenterology

## 2019-05-06 ENCOUNTER — Ambulatory Visit: Payer: 59 | Admitting: Cardiology

## 2019-05-09 ENCOUNTER — Ambulatory Visit: Payer: 59 | Admitting: Cardiology

## 2019-05-21 ENCOUNTER — Other Ambulatory Visit: Payer: Self-pay | Admitting: Family Medicine

## 2019-06-06 ENCOUNTER — Other Ambulatory Visit: Payer: Self-pay

## 2019-06-06 ENCOUNTER — Ambulatory Visit (AMBULATORY_SURGERY_CENTER): Payer: Self-pay | Admitting: *Deleted

## 2019-06-06 VITALS — Temp 96.8°F | Ht 64.0 in | Wt 250.6 lb

## 2019-06-06 DIAGNOSIS — Z800 Family history of malignant neoplasm of digestive organs: Secondary | ICD-10-CM

## 2019-06-06 DIAGNOSIS — Z01818 Encounter for other preprocedural examination: Secondary | ICD-10-CM

## 2019-06-06 MED ORDER — SUPREP BOWEL PREP KIT 17.5-3.13-1.6 GM/177ML PO SOLN: ORAL | 0 refills | Status: DC

## 2019-06-06 NOTE — Progress Notes (Signed)
covid test 06-17-19 at 11:00 am  Pt is aware that care partner will wait in the car during procedure; if they feel like they will be too hot or cold to wait in the car; they may wait in the 4 th floor lobby. Patient is aware to bring only one care partner. We want them to wear a mask (we do not have any that we can provide them), practice social distancing, and we will check their temperatures when they get here.  I did remind the patient that their care partner needs to stay in the parking lot the entire time and have a cell phone available, we will call them when the pt is ready for discharge. Patient will wear mask into building.   No trouble with anesthesia, difficulty with intubation or hx/fam hx of malignant hyperthermia per pt   No egg or soy allergy  No home oxygen use   No medications for weight loss taken  emmi information given  Pt denies constipation issues  Suprep coupon given and code put into RX

## 2019-06-17 ENCOUNTER — Ambulatory Visit (INDEPENDENT_AMBULATORY_CARE_PROVIDER_SITE_OTHER): Payer: 59

## 2019-06-17 ENCOUNTER — Other Ambulatory Visit: Payer: Self-pay | Admitting: Internal Medicine

## 2019-06-17 DIAGNOSIS — Z1159 Encounter for screening for other viral diseases: Secondary | ICD-10-CM

## 2019-06-17 LAB — SARS CORONAVIRUS 2 (TAT 6-24 HRS): SARS Coronavirus 2: NEGATIVE

## 2019-06-19 ENCOUNTER — Encounter: Payer: Self-pay | Admitting: Gastroenterology

## 2019-06-20 ENCOUNTER — Other Ambulatory Visit: Payer: Self-pay

## 2019-06-20 ENCOUNTER — Encounter: Payer: Self-pay | Admitting: Gastroenterology

## 2019-06-20 ENCOUNTER — Ambulatory Visit (AMBULATORY_SURGERY_CENTER): Payer: 59 | Admitting: Gastroenterology

## 2019-06-20 VITALS — BP 147/71 | HR 76 | Temp 97.3°F | Resp 19 | Ht 64.0 in | Wt 250.6 lb

## 2019-06-20 DIAGNOSIS — Z800 Family history of malignant neoplasm of digestive organs: Secondary | ICD-10-CM | POA: Diagnosis present

## 2019-06-20 DIAGNOSIS — D125 Benign neoplasm of sigmoid colon: Secondary | ICD-10-CM | POA: Diagnosis not present

## 2019-06-20 DIAGNOSIS — Z1211 Encounter for screening for malignant neoplasm of colon: Secondary | ICD-10-CM | POA: Diagnosis not present

## 2019-06-20 DIAGNOSIS — D122 Benign neoplasm of ascending colon: Secondary | ICD-10-CM | POA: Diagnosis not present

## 2019-06-20 MED ORDER — SODIUM CHLORIDE 0.9 % IV SOLN
500.0000 mL | Freq: Once | INTRAVENOUS | Status: DC
Start: 2019-06-20 — End: 2019-06-20

## 2019-06-20 NOTE — Patient Instructions (Signed)
YOU HAD AN ENDOSCOPIC PROCEDURE TODAY AT THE [facility]:   Refer to the procedure report that was given to you for any specific questions about what was found during the examination.  If the procedure report does not answer your questions, please call your gastroenterologist to clarify.  If you requested that your care partner not be given the details of your procedure findings, then the procedure report has been included in a sealed envelope for you to review at your convenience later.  YOU SHOULD EXPECT: Some feelings of bloating in the abdomen. Passage of more gas than usual.  Walking can help get rid of the air that was put into your GI tract during the procedure and reduce the bloating. If you had a lower endoscopy (such as a colonoscopy or flexible sigmoidoscopy) you may notice spotting of blood in your stool or on the toilet paper. If you underwent a bowel prep for your procedure, you may not have a normal bowel movement for a few days.  Please Note:  You might notice some irritation and congestion in your nose or some drainage.  This is from the oxygen used during your procedure.  There is no need for concern and it should clear up in a day or so.  SYMPTOMS TO REPORT IMMEDIATELY:   Following lower endoscopy (colonoscopy or flexible sigmoidoscopy):  Excessive amounts of blood in the stool  Significant tenderness or worsening of abdominal pains  Swelling of the abdomen that is new, acute  Fever of 100F or higher  For urgent or emergent issues, a gastroenterologist can be reached at any hour by calling [phone]. Do not use MyChart messaging for urgent concerns.    DIET:  We do recommend a small meal at first, but then you may proceed to your regular diet.  Drink plenty of fluids but you should avoid alcoholic beverages for 24 hours.  ACTIVITY:  You should plan to take it easy for the rest of today and you should NOT DRIVE or use heavy machinery until tomorrow (because  of the sedation medicines used during the test).    FOLLOW UP: Our staff will call the number listed on your records 48-72 hours following your procedure to check on you and address any questions or concerns that you may have regarding the information given to you following your procedure. If we do not reach you, we will leave a message.  We will attempt to reach you two times.  During this call, we will ask if you have developed any symptoms of COVID 19. If you develop any symptoms (ie: fever, flu-like symptoms, shortness of breath, cough etc.) before then, please call [phone].  If you test positive for Covid 19 in the 2 weeks post procedure, please call and report this information to us.    If any biopsies were taken you will be contacted by phone or by letter within the next 1-3 weeks.  Please call us at [phone] if you have not heard about the biopsies in 3 weeks.    SIGNATURES/CONFIDENTIALITY: You and/or your care partner have signed paperwork which will be entered into your electronic medical record.  These signatures attest to the fact that that the information above on your After Visit Summary has been reviewed and is understood.  Full responsibility of the confidentiality of this discharge information lies with you and/or your care-partner. 

## 2019-06-20 NOTE — Progress Notes (Signed)
Nancy Burch  Pt's states no medical or surgical changes since previsit or office visit.

## 2019-06-20 NOTE — Progress Notes (Signed)
Report to PACU, RN, vss, BBS= Clear.  

## 2019-06-20 NOTE — Op Note (Signed)
Nancy Burch Patient Name: Nancy Burch Procedure Date: 06/20/2019 2:52 PM MRN: [id number] Endoscopist: Thornton Park MD, MD Age: 59 Referring MD:  Date of Birth: 07/24/1960 Gender: Female Account #: [id number] Procedure:                Colonoscopy Indications:              Screening in patient at increased risk: Family                            history of 1st-degree relative with colorectal                            cancer before age 78 years                           Sister with colon cancer at age 70                           Colonoscopy with Dr. Olevia Perches 02/08/13 revealed                            internal hemorrhoids Medicines:                Monitored Anesthesia Care Procedure:                Pre-Anesthesia Assessment:                           - Prior to the procedure, a History and Physical                            was performed, and patient medications and                            allergies were reviewed. The patient's tolerance of                            previous anesthesia was also reviewed. The risks                            and benefits of the procedure and the sedation                            options and risks were discussed with the patient.                            All questions were answered, and informed consent                            was obtained. Prior Anticoagulants: The patient has                            taken no previous anticoagulant or antiplatelet  agents. ASA Grade Assessment: II - A patient with                            mild systemic disease. After reviewing the risks                            and benefits, the patient was deemed in                            satisfactory condition to undergo the procedure.                           After obtaining informed consent, the colonoscope                            was passed under direct vision. Throughout the                            procedure,  the patient's blood pressure, pulse, and                            oxygen saturations were monitored continuously. The                            Colonoscope was introduced through the anus and                            advanced to the 3 cm into the ileum. The                            colonoscopy was performed with moderate difficulty                            due to restricted mobility of the colon. Successful                            completion of the procedure was aided by                            withdrawing the scope and replacing with the                            pediatric endoscope. The patient tolerated the                            procedure well. The quality of the bowel                            preparation was good. The terminal ileum, ileocecal                            valve, appendiceal orifice, and rectum were  photographed. Scope In: 3:07:18 PM Scope Out: 3:32:26 PM Scope Withdrawal Time: 0 hours 18 minutes 4 seconds  Total Procedure Duration: 0 hours 25 minutes 8 seconds  Findings:                 The perianal and digital rectal examinations were                            normal.                           Non-bleeding internal hemorrhoids were found. The                            hemorrhoids were small.                           Two sessile polyps were found in the distal                            ascending colon. The polyps were 2 to 3 mm in size.                            These polyps were removed with a cold snare.                            Resection and retrieval were complete. Estimated                            blood loss was minimal.                           A 7 mm polyp was found in the sigmoid colon. The                            polyp was semi-pedunculated. The polyp was removed                            with a cold snare. Resection and retrieval were                            complete. Estimated blood loss was  minimal.                           The exam was otherwise without abnormality on                            direct and retroflexion views. Complications:            No immediate complications. Estimated blood loss:                            Minimal. Estimated Blood Loss:     Estimated blood loss was minimal. Impression:               - Non-bleeding internal hemorrhoids.                           -  Two 2 to 3 mm polyps in the distal ascending                            colon, removed with a cold snare. Resected and                            retrieved.                           - One 7 mm polyp in the sigmoid colon, removed with                            a cold snare. Resected and retrieved.                           - The examination was otherwise normal on direct                            and retroflexion views. Recommendation:           - Patient has a contact number available for                            emergencies. The signs and symptoms of potential                            delayed complications were discussed with the                            patient. Return to normal activities tomorrow.                            Written discharge instructions were provided to the                            patient.                           - Resume previous diet.                           - Continue present medications.                           - Await pathology results.                           - Repeat colonoscopy in 3 years for surveillance if                            all 3 polyps are adenomas.                           - Emerging evidence supports eating a diet of  fruits, vegetables, grains, calcium, and yogurt                            while reducing red meat and alcohol may reduce the                            risk of colon cancer.                           - Given these results, all first degree relatives                            (brothers,  sisters, children, parents) should start                            colon cancer screening at age 47.                           - Thank you for allowing me to be involved in your                            colon cancer prevention. Thornton Park MD, MD 06/20/2019 3:40:58 PM This report has been signed electronically.

## 2019-06-20 NOTE — Progress Notes (Signed)
Called to room to assist during endoscopic procedure.  Patient ID and intended procedure confirmed with present staff. Received instructions for my participation in the procedure from the performing physician.  

## 2019-06-24 ENCOUNTER — Telehealth: Payer: Self-pay

## 2019-06-24 ENCOUNTER — Telehealth: Payer: Self-pay | Admitting: *Deleted

## 2019-06-24 DIAGNOSIS — Z5181 Encounter for therapeutic drug level monitoring: Secondary | ICD-10-CM | POA: Insufficient documentation

## 2019-06-24 NOTE — Telephone Encounter (Signed)
No answer, left message to call if having any issues or concerns, B.Townsend Cudworth RN 

## 2019-06-24 NOTE — Telephone Encounter (Signed)
No answer for post procedure call back. Left message for patient and will call back later this afternoon.

## 2019-06-25 ENCOUNTER — Encounter: Payer: Self-pay | Admitting: Gastroenterology

## 2019-07-08 ENCOUNTER — Other Ambulatory Visit: Payer: Self-pay | Admitting: Family Medicine

## 2019-07-08 DIAGNOSIS — K219 Gastro-esophageal reflux disease without esophagitis: Secondary | ICD-10-CM

## 2019-07-08 NOTE — Telephone Encounter (Signed)
Needs to return for repeat TSH that has been ordered for her.

## 2019-07-09 NOTE — Telephone Encounter (Signed)
Appointment scheduled for pt to come in for repeat TSH

## 2019-07-10 ENCOUNTER — Other Ambulatory Visit: Payer: Self-pay

## 2019-07-11 ENCOUNTER — Other Ambulatory Visit (INDEPENDENT_AMBULATORY_CARE_PROVIDER_SITE_OTHER): Payer: 59

## 2019-07-11 DIAGNOSIS — E063 Autoimmune thyroiditis: Secondary | ICD-10-CM | POA: Diagnosis not present

## 2019-07-11 DIAGNOSIS — E0580 Other thyrotoxicosis without thyrotoxic crisis or storm: Secondary | ICD-10-CM | POA: Diagnosis not present

## 2019-07-11 LAB — TSH: TSH: 1.05 u[IU]/mL (ref 0.35–4.50)

## 2019-07-14 ENCOUNTER — Other Ambulatory Visit: Payer: Self-pay

## 2019-07-14 DIAGNOSIS — E038 Other specified hypothyroidism: Secondary | ICD-10-CM

## 2019-07-14 MED ORDER — LEVOTHYROXINE SODIUM 150 MCG PO TABS: 150.0000 ug | ORAL_TABLET | Freq: Every day | ORAL | 2 refills | Status: DC

## 2019-11-16 ENCOUNTER — Other Ambulatory Visit: Payer: Self-pay | Admitting: Family Medicine

## 2019-11-16 DIAGNOSIS — K219 Gastro-esophageal reflux disease without esophagitis: Secondary | ICD-10-CM

## 2019-11-19 NOTE — Telephone Encounter (Signed)
Called patient to schedule appointment patient states that she will check her schedule and give [place] a call back.

## 2019-12-09 ENCOUNTER — Other Ambulatory Visit: Payer: Self-pay | Admitting: Family Medicine

## 2019-12-09 DIAGNOSIS — K219 Gastro-esophageal reflux disease without esophagitis: Secondary | ICD-10-CM

## 2019-12-18 NOTE — Telephone Encounter (Signed)
Called patient to schedule appointment no answer LMTCB and schedule appointment for follow up on medications.

## 2020-01-02 ENCOUNTER — Other Ambulatory Visit: Payer: Self-pay | Admitting: Family Medicine

## 2020-01-02 DIAGNOSIS — K219 Gastro-esophageal reflux disease without esophagitis: Secondary | ICD-10-CM

## 2020-01-02 NOTE — Telephone Encounter (Signed)
Last VV 04/15/19 Last fill for both meds 12/09/19  #30/1

## 2020-01-05 ENCOUNTER — Other Ambulatory Visit: Payer: Self-pay

## 2020-01-05 MED ORDER — FAMOTIDINE 40 MG PO TABS
40.0000 mg | ORAL_TABLET | Freq: Every day | ORAL | 1 refills | Status: DC
Start: 2020-01-05 — End: 2020-06-28

## 2020-04-12 ENCOUNTER — Other Ambulatory Visit: Payer: Self-pay | Admitting: Family Medicine

## 2020-04-12 DIAGNOSIS — E063 Autoimmune thyroiditis: Secondary | ICD-10-CM

## 2020-04-12 DIAGNOSIS — E038 Other specified hypothyroidism: Secondary | ICD-10-CM

## 2020-06-27 ENCOUNTER — Other Ambulatory Visit: Payer: Self-pay | Admitting: Family Medicine

## 2020-10-15 ENCOUNTER — Other Ambulatory Visit: Payer: Self-pay

## 2020-10-15 ENCOUNTER — Emergency Department (HOSPITAL_COMMUNITY): Payer: 59

## 2020-10-15 ENCOUNTER — Encounter (HOSPITAL_COMMUNITY): Payer: Self-pay

## 2020-10-15 ENCOUNTER — Emergency Department (HOSPITAL_COMMUNITY)
Admission: EM | Admit: 2020-10-15 | Discharge: 2020-10-16 | Disposition: A | Discharge status: Home / Self Care | Payer: 59 | Attending: Emergency Medicine | Admitting: Emergency Medicine

## 2020-10-15 DIAGNOSIS — Y92410 Unspecified street and highway as the place of occurrence of the external cause: Secondary | ICD-10-CM | POA: Insufficient documentation

## 2020-10-15 DIAGNOSIS — Z87891 Personal history of nicotine dependence: Secondary | ICD-10-CM | POA: Insufficient documentation

## 2020-10-15 DIAGNOSIS — R0789 Other chest pain: Secondary | ICD-10-CM | POA: Insufficient documentation

## 2020-10-15 DIAGNOSIS — J45909 Unspecified asthma, uncomplicated: Secondary | ICD-10-CM | POA: Diagnosis not present

## 2020-10-15 DIAGNOSIS — Z79899 Other long term (current) drug therapy: Secondary | ICD-10-CM | POA: Insufficient documentation

## 2020-10-15 DIAGNOSIS — Z96651 Presence of right artificial knee joint: Secondary | ICD-10-CM | POA: Insufficient documentation

## 2020-10-15 DIAGNOSIS — E039 Hypothyroidism, unspecified: Secondary | ICD-10-CM | POA: Diagnosis not present

## 2020-10-15 LAB — I-STAT CHEM 8, ED
BUN: 8 mg/dL (ref 6–20)
Calcium, Ion: 1.24 mmol/L (ref 1.15–1.40)
Chloride: 107 mmol/L (ref 98–111)
Creatinine, Ser: 0.7 mg/dL (ref 0.44–1.00)
Glucose, Bld: 94 mg/dL (ref 70–99)
HCT: 39 % (ref 36.0–46.0)
Hemoglobin: 13.3 g/dL (ref 12.0–15.0)
Potassium: 3.5 mmol/L (ref 3.5–5.1)
Sodium: 143 mmol/L (ref 135–145)
TCO2: 26 mmol/L (ref 22–32)

## 2020-10-15 MED ORDER — IBUPROFEN 600 MG PO TABS: 600.0000 mg | ORAL_TABLET | Freq: Four times a day (QID) | ORAL | 0 refills | Status: DC | PRN

## 2020-10-15 MED ORDER — IOHEXOL 350 MG/ML SOLN
55.0000 mL | Freq: Once | INTRAVENOUS | Status: AC | PRN
  Administered 2020-10-15: 55 mL via INTRAVENOUS

## 2020-10-15 MED ORDER — CYCLOBENZAPRINE HCL 10 MG PO TABS: 10.0000 mg | ORAL_TABLET | Freq: Two times a day (BID) | ORAL | 0 refills | Status: DC | PRN

## 2020-10-15 MED ORDER — MORPHINE SULFATE (PF) 4 MG/ML IV SOLN
4.0000 mg | Freq: Once | INTRAVENOUS | Status: AC
  Administered 2020-10-15: 4 mg via INTRAVENOUS
  Filled 2020-10-15: qty 1

## 2020-10-15 NOTE — ED Notes (Signed)
Provider at bedside at this time
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
## 2020-10-15 NOTE — ED Notes (Signed)
Pt ambulated to restroom at this time.
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
## 2020-10-28 DIAGNOSIS — M19012 Primary osteoarthritis, left shoulder: Secondary | ICD-10-CM | POA: Insufficient documentation

## 2021-01-12 ENCOUNTER — Other Ambulatory Visit: Payer: Self-pay | Admitting: Family

## 2021-01-12 ENCOUNTER — Other Ambulatory Visit: Payer: Self-pay

## 2021-01-12 ENCOUNTER — Ambulatory Visit: Payer: 59 | Admitting: Family Medicine

## 2021-01-12 VITALS — BP 124/80 | HR 95 | Temp 97.6°F | Ht 63.0 in | Wt 237.4 lb

## 2021-01-12 DIAGNOSIS — E038 Other specified hypothyroidism: Secondary | ICD-10-CM

## 2021-01-12 DIAGNOSIS — E063 Autoimmune thyroiditis: Secondary | ICD-10-CM

## 2021-01-12 DIAGNOSIS — H65491 Other chronic nonsuppurative otitis media, right ear: Secondary | ICD-10-CM | POA: Diagnosis not present
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
## 2021-03-04 ENCOUNTER — Ambulatory Visit (INDEPENDENT_AMBULATORY_CARE_PROVIDER_SITE_OTHER): Payer: 59 | Admitting: Family Medicine

## 2021-03-04 ENCOUNTER — Other Ambulatory Visit: Payer: Self-pay

## 2021-03-04 ENCOUNTER — Encounter: Payer: Self-pay | Admitting: Family Medicine

## 2021-03-04 VITALS — BP 142/78 | HR 80 | Temp 96.9°F | Ht 63.0 in | Wt 241.0 lb

## 2021-03-04 DIAGNOSIS — R911 Solitary pulmonary nodule: Secondary | ICD-10-CM

## 2021-03-04 DIAGNOSIS — E038 Other specified hypothyroidism: Secondary | ICD-10-CM

## 2021-03-04 DIAGNOSIS — Z0000 Encounter for general adult medical examination without abnormal findings: Secondary | ICD-10-CM

## 2021-03-04 DIAGNOSIS — Z131 Encounter for screening for diabetes mellitus: Secondary | ICD-10-CM | POA: Insufficient documentation

## 2021-03-04 DIAGNOSIS — E063 Autoimmune thyroiditis: Secondary | ICD-10-CM | POA: Diagnosis not present

## 2021-03-04 DIAGNOSIS — F418 Other specified anxiety disorders: Secondary | ICD-10-CM

## 2021-03-04 DIAGNOSIS — E782 Mixed hyperlipidemia: Secondary | ICD-10-CM

## 2021-03-04 DIAGNOSIS — K8020 Calculus of gallbladder without cholecystitis without obstruction: Secondary | ICD-10-CM

## 2021-03-04 LAB — COMPREHENSIVE METABOLIC PANEL
ALT: 34 U/L (ref 0–35)
AST: 16 U/L (ref 0–37)
Albumin: 4.2 g/dL (ref 3.5–5.2)
Alkaline Phosphatase: 87 U/L (ref 39–117)
BUN: 24 mg/dL — ABNORMAL HIGH (ref 6–23)
CO2: 27 mEq/L (ref 19–32)
Calcium: 9.5 mg/dL (ref 8.4–10.5)
Chloride: 105 mEq/L (ref 96–112)
Creatinine, Ser: 0.76 mg/dL (ref 0.40–1.20)
GFR: 84.77 mL/min (ref >60.00)
Glucose, Bld: 104 mg/dL — ABNORMAL HIGH (ref 70–99)
Potassium: 4.4 mEq/L (ref 3.5–5.1)
Sodium: 141 mEq/L (ref 135–145)
Total Bilirubin: 0.5 mg/dL (ref 0.2–1.2)
Total Protein: 6.3 g/dL (ref 6.0–8.3)

## 2021-03-04 LAB — URINALYSIS, ROUTINE W REFLEX MICROSCOPIC
Bilirubin Urine: NEGATIVE
Hgb urine dipstick: NEGATIVE
Ketones, ur: NEGATIVE
Leukocytes,Ua: NEGATIVE
Nitrite: NEGATIVE
RBC / HPF: NONE SEEN (ref 0–?)
Specific Gravity, Urine: >=1.03 — AB (ref 1.000–1.030)
Total Protein, Urine: NEGATIVE
Urine Glucose: NEGATIVE
Urobilinogen, UA: 0.2 (ref 0.0–1.0)
pH: 5.5 (ref 5.0–8.0)

## 2021-03-04 LAB — LIPID PANEL
Cholesterol: 267 mg/dL — ABNORMAL HIGH (ref 0–200)
HDL: 41.9 mg/dL (ref >39.00)
NonHDL: 225.49
Total CHOL/HDL Ratio: 6
Triglycerides: 205 mg/dL — ABNORMAL HIGH (ref 0.0–149.0)
VLDL: 41 mg/dL — ABNORMAL HIGH (ref 0.0–40.0)

## 2021-03-04 LAB — TSH: TSH: 0.86 u[IU]/mL (ref 0.35–5.50)

## 2021-03-04 LAB — CBC
HCT: 40.5 % (ref 36.0–46.0)
Hemoglobin: 13.3 g/dL (ref 12.0–15.0)
MCHC: 32.8 g/dL (ref 30.0–36.0)
MCV: 86.8 fl (ref 78.0–100.0)
Platelets: 322 10*3/uL (ref 150.0–400.0)
RBC: 4.67 Mil/uL (ref 3.87–5.11)
RDW: 13.5 % (ref 11.5–15.5)
WBC: 7.8 10*3/uL (ref 4.0–10.5)

## 2021-03-04 LAB — LDL CHOLESTEROL, DIRECT: Direct LDL: 192 mg/dL

## 2021-03-04 MED ORDER — WEGOVY 0.5 MG/0.5ML ~~LOC~~ SOAJ: 0.5000 mg | SUBCUTANEOUS | 5 refills | Status: DC

## 2021-03-04 NOTE — Progress Notes (Addendum)
Established Patient Office Visit  Subjective:  Patient ID: Nancy Burch, female    DOB: 1960-11-07  Age: 61 y.o. MRN: [id number]  CC:  Chief Complaint  Patient presents with   Annual Exam    CPE, would like follow up on CT where nodule was noted, discuss wegovy.     HPI Nancy Burch presents for for health check and follow-up of hypothyroidism, obesity, incidental pulmonary nodule.  Anxiety and depression.  Continues with levothyroxine.  GYN has been managing her depression with duloxetine successfully.  She is referred her back to me.  She is interested in taking semaglutide for weight loss.  She had taken Saxenda with some success but developed nausea.  Incidental pulmonary nodule noted in the right middle lobe.  She quit smoking in 2017.  Last Pap smear in May of this past year.  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 treated by [facility] dermatologist 06/16/2015   Hashimoto's thyroiditis 06/16/2015   Hypothyroidism    Primary localized osteoarthritis of right knee 06/16/2015   Pure hypercholesterolemia 06/16/2015    Past Surgical History:  Procedure Laterality Date   BIOPSY THYROID  01/28/2013   CARPOMETACARPAL ([place]) FUSION [place], 1989   COLONOSCOPY     TOTAL KNEE ARTHROPLASTY Right 06/28/2015   Procedure: TOTAL KNEE ARTHROPLASTY;  Surgeon: Elsie Saas, MD;  Location: [facility];  Service: Orthopedics;  Laterality: Right;   vertebrae fusion     02-26-2019    x4   WISDOM TOOTH EXTRACTION      Family History  Problem Relation Age of Onset   Breast cancer Mother    Thyroid disease Mother    Melanoma Father    Diabetes Sister    Colon cancer Sister 83   Thyroid disease Sister    Diabetes Sister    Diabetes Sister    Diabetes Sister    Stomach cancer Neg Hx    Esophageal cancer Neg Hx    Rectal cancer Neg Hx      Social History   Socioeconomic History   Marital status: Married    Spouse name: Nancy Burch   Number of children: Not on file   Years of education: Not on file   Highest education level: Not on file  Occupational History   Occupation: controller  Tobacco Use   Smoking status: Former    Types: Cigarettes    Quit date: 06/27/2005    Years since quitting: 15.8   Smokeless tobacco: Never  Vaping Use   Vaping Use: Never used  Substance and Sexual Activity   Alcohol use: Yes    Alcohol/week: 2.0 standard drinks    Types: 2 Glasses of wine per week    Comment: 2 galsses per month   Drug use: No   Sexual activity: Yes  Other Topics Concern   Not on file  Social History Narrative   Not on file   Social Determinants of Health   Financial Resource Strain: Not on file  Food Insecurity: Not on file  Transportation Needs: Not on file  Physical Activity: Not on file  Stress: Not on file  Social Connections: Not on file  Intimate Partner Violence: Not on file    Outpatient Medications Prior to Visit  Medication Sig Dispense Refill  albuterol (PROVENTIL HFA;VENTOLIN HFA) 108 (90 Base) MCG/ACT inhaler Inhale 2 puffs into the lungs every 6 (six) hours as needed for shortness of breath. 1 Inhaler 0   diclofenac (VOLTAREN) 75 MG EC tablet Take by mouth.     DULoxetine (CYMBALTA) 60 MG capsule Take 120 mg by mouth daily.      famotidine (PEPCID) 40 MG tablet TAKE 1 TABLET BY MOUTH EVERY DAY 30 tablet 0   fexofenadine (ALLEGRA) 180 MG tablet Take 180 mg by mouth daily.     levothyroxine (SYNTHROID) 150 MCG tablet TAKE 1 TABLET BY MOUTH EVERY DAY BEFORE BREAKFAST 90 tablet 2   omeprazole (PRILOSEC) 40 MG capsule TAKE 1 CAPSULE BY MOUTH EVERY DAY 30 capsule 1   tiZANidine (ZANAFLEX) 4 MG tablet Take 4 mg by mouth every 8 (eight) hours as needed.     No facility-administered medications prior to visit.    Allergies  Allergen Reactions   Penicillins Hives    ROS Review of  Systems  Constitutional:  Negative for diaphoresis, fatigue, fever and unexpected weight change.  HENT: Negative.    Eyes:  Negative for photophobia and visual disturbance.  Respiratory: Negative.    Cardiovascular: Negative.   Gastrointestinal: Negative.   Endocrine: Negative for polyphagia.  Genitourinary: Negative.   Musculoskeletal:  Positive for arthralgias. Negative for gait problem and joint swelling.  Skin:  Negative for pallor and rash.  Neurological:  Negative for speech difficulty and weakness.  Hematological:  Does not bruise/bleed easily.  Psychiatric/Behavioral: Negative.       Objective:    Physical Exam Vitals and nursing note reviewed.  Constitutional:      General: She is not in acute distress.    Appearance: Normal appearance. She is obese. She is not ill-appearing, toxic-appearing or diaphoretic.  HENT:     Head: Normocephalic and atraumatic.     Right Ear: Tympanic membrane, ear canal and external ear normal.     Left Ear: Tympanic membrane, ear canal and external ear normal.     Mouth/Throat:     Mouth: Mucous membranes are moist.     Pharynx: Oropharynx is clear. No oropharyngeal exudate or posterior oropharyngeal erythema.  Eyes:     General:        Right eye: No discharge.        Left eye: No discharge.     Extraocular Movements: Extraocular movements intact.     Conjunctiva/sclera: Conjunctivae normal.     Pupils: Pupils are equal, round, and reactive to light.  Neck:     Vascular: No carotid bruit.  Cardiovascular:     Rate and Rhythm: Normal rate and regular rhythm.  Pulmonary:     Effort: Pulmonary effort is normal.     Breath sounds: Normal breath sounds.  Abdominal:     General: Bowel sounds are normal.  Musculoskeletal:     Cervical back: No rigidity or tenderness.     Right lower leg: No edema.     Left lower leg: No edema.  Lymphadenopathy:     Cervical: No cervical adenopathy.  Skin:    General: Skin is warm and dry.   Neurological:     Mental Status: She is alert and oriented to person, place, and time.  Psychiatric:        Mood and Affect: Mood normal.        Behavior: Behavior normal.    BP (!) 142/78 (BP Location: Right Arm, Patient Position: Sitting, Cuff Size: Large)  Pulse 80    Temp (!) 96.9 F (36.1 C) (Temporal)    Ht 5\' 3"  (1.6 m)    Wt 241 lb (109.3 kg)    LMP 08/16/2011    SpO2 95%    BMI 42.69 kg/m  Wt Readings from Last 3 Encounters:  03/04/21 241 lb (109.3 kg)  01/12/21 237 lb 6.4 oz (107.7 kg)  10/15/20 230 lb (104.3 kg)     Health Maintenance Due  Topic Date Due   Hepatitis C Screening  Never done   MAMMOGRAM  04/24/2018    There are no preventive care reminders to display for this patient.  Lab Results  Component Value Date   TSH 0.86 03/04/2021   Lab Results  Component Value Date   WBC 7.8 03/04/2021   HGB 13.3 03/04/2021   HCT 40.5 03/04/2021   MCV 86.8 03/04/2021   PLT 322.0 03/04/2021   Lab Results  Component Value Date   NA 141 03/04/2021   K 4.4 03/04/2021   CO2 27 03/04/2021   GLUCOSE 104 (H) 03/04/2021   BUN 24 (H) 03/04/2021   CREATININE 0.76 03/04/2021   BILITOT 0.5 03/04/2021   ALKPHOS 87 03/04/2021   AST 16 03/04/2021   ALT 34 03/04/2021   PROT 6.3 03/04/2021   ALBUMIN 4.2 03/04/2021   CALCIUM 9.5 03/04/2021   ANIONGAP 10 06/30/2015   GFR 84.77 03/04/2021   Lab Results  Component Value Date   CHOL 267 (H) 03/04/2021   Lab Results  Component Value Date   HDL 41.90 03/04/2021   Lab Results  Component Value Date   LDLCALC 175 04/05/2017   Lab Results  Component Value Date   TRIG 205.0 (H) 03/04/2021   Lab Results  Component Value Date   CHOLHDL 6 03/04/2021   Lab Results  Component Value Date   HGBA1C 5.2 04/05/2017      Assessment & Plan:   Problem List Items Addressed This Visit       Digestive   Calculus of gallbladder without cholecystitis without obstruction     Endocrine   Hypothyroidism due to  Hashimoto's thyroiditis - Primary   Relevant Orders   TSH (Completed)     Other   Morbid obesity ([place])   Relevant Medications   Semaglutide-Weight Management (WEGOVY) 0.5 MG/0.5ML SOAJ   Mixed hyperlipidemia   Relevant Orders   Comprehensive metabolic panel (Completed)   Lipid panel (Completed)   LDL cholesterol, direct (Completed)   Pulmonary nodule   Relevant Orders   CT CHEST WO CONTRAST (Completed)   CT Chest Wo Contrast   Healthcare maintenance   Relevant Orders   CBC (Completed)   Urinalysis, Routine w reflex microscopic (Completed)   Other Visit Diagnoses     Anxiety with depression           Meds ordered this encounter  Medications   Semaglutide-Weight Management (WEGOVY) 0.5 MG/0.5ML SOAJ    Sig: Inject 0.5 mg into the skin once a week.    Dispense:  2 mL    Refill:  5    Follow-up: Return in about 3 months (around 06/02/2021).  Will give semaglutide and try.  Advised to avoid high carbohydrate loads.  Follow-up with GYN.  Repeat CT scan for pulmonary nodule ordered for February.  BP mildly elevated today we will follow-up in 3 months.  Declines flu, COVID, and shingles vaccines.  Information was given on health maintenance and disease prevention.  Information given on semaglutide.  We will follow-up with  dermatology for dry skin  Libby Maw, MD

## 2021-03-12 ENCOUNTER — Telehealth (HOSPITAL_BASED_OUTPATIENT_CLINIC_OR_DEPARTMENT_OTHER): Payer: Self-pay

## 2021-04-08 ENCOUNTER — Telehealth: Payer: Self-pay | Admitting: Family Medicine

## 2021-04-08 NOTE — Telephone Encounter (Signed)
Dani from [facility] is calling in reference to pt's scheduled appointment CT CHEST NODULE FOLLOW UP LOW DOSE W/O (Order [id number]) this coming 04/11/21. Dani needs to speak with someone concerning-pt's [employer] has no auth on file for this procedure. Please advise Dani at [phone]  ext [phone]

## 2021-04-11 ENCOUNTER — Ambulatory Visit (HOSPITAL_BASED_OUTPATIENT_CLINIC_OR_DEPARTMENT_OTHER)
Admission: RE | Admit: 2021-04-11 | Discharge: 2021-04-11 | Disposition: A | Discharge status: Home / Self Care | Payer: 59 | Source: Ambulatory Visit | Attending: Family Medicine | Admitting: Family Medicine

## 2021-04-11 ENCOUNTER — Other Ambulatory Visit: Payer: Self-pay

## 2021-04-11 DIAGNOSIS — R911 Solitary pulmonary nodule: Secondary | ICD-10-CM | POA: Insufficient documentation

## 2021-04-11 NOTE — Addendum Note (Signed)
Addended by: Jon Billings on: 04/11/2021 07:42 AM   Modules accepted: Orders

## 2021-04-12 NOTE — Addendum Note (Signed)
Addended by: Jon Billings on: 04/12/2021 08:20 AM   Modules accepted: Orders

## 2021-06-14 ENCOUNTER — Ambulatory Visit: Payer: 59 | Admitting: Family Medicine

## 2021-08-02 ENCOUNTER — Telehealth: Payer: Self-pay

## 2021-08-02 NOTE — Telephone Encounter (Signed)
Contacted to discuss overdue mammogram, no answer. LVMTCB.

## 2021-09-05 DIAGNOSIS — H938X3 Other specified disorders of ear, bilateral: Secondary | ICD-10-CM | POA: Insufficient documentation

## 2021-10-19 ENCOUNTER — Telehealth: Payer: Self-pay

## 2021-10-19 NOTE — Telephone Encounter (Signed)
Called patient regarding refill on medication and follow up appointment. Per patient she' been taking meds daily still have quite a bit left would not like to come in for follow up appointment patient states that she will go to endocrinologist for refill on Medication (Levothyroxine) will call us back when it's time for her next annual visit.

## 2021-11-22 ENCOUNTER — Telehealth: Payer: Self-pay

## 2021-11-22 NOTE — Telephone Encounter (Signed)
Unable to reach pt LVM to call back.

## 2021-12-23 ENCOUNTER — Encounter: Payer: Self-pay | Admitting: Family Medicine

## 2021-12-23 DIAGNOSIS — E038 Other specified hypothyroidism: Secondary | ICD-10-CM

## 2021-12-23 MED ORDER — LEVOTHYROXINE SODIUM 150 MCG PO TABS: ORAL_TABLET | ORAL | 0 refills | Status: DC

## 2022-01-14 DIAGNOSIS — Z1371 Encounter for nonprocreative screening for genetic disease carrier status: Secondary | ICD-10-CM | POA: Insufficient documentation

## 2022-03-16 ENCOUNTER — Encounter: Payer: Self-pay | Admitting: Family Medicine

## 2022-03-16 ENCOUNTER — Ambulatory Visit: Payer: 59 | Admitting: Family Medicine

## 2022-03-16 VITALS — BP 136/86 | HR 80 | Temp 97.8°F | Ht 64.0 in | Wt 258.0 lb

## 2022-03-16 DIAGNOSIS — H6990 Unspecified Eustachian tube disorder, unspecified ear: Secondary | ICD-10-CM

## 2022-03-16 DIAGNOSIS — E782 Mixed hyperlipidemia: Secondary | ICD-10-CM

## 2022-03-16 DIAGNOSIS — E038 Other specified hypothyroidism: Secondary | ICD-10-CM | POA: Diagnosis not present

## 2022-03-16 DIAGNOSIS — E063 Autoimmune thyroiditis: Secondary | ICD-10-CM

## 2022-03-16 DIAGNOSIS — J0100 Acute maxillary sinusitis, unspecified: Secondary | ICD-10-CM

## 2022-03-16 MED ORDER — CLARITHROMYCIN ER 500 MG PO TB24: 1000.0000 mg | ORAL_TABLET | Freq: Every day | ORAL | 0 refills | Status: AC

## 2022-03-16 MED ORDER — PREDNISONE 10 MG PO TABS: 10.0000 mg | ORAL_TABLET | Freq: Two times a day (BID) | ORAL | 0 refills | Status: AC

## 2022-03-16 NOTE — Progress Notes (Addendum)
Established Patient Office Visit   Subjective:  Patient ID: Nancy Burch, female    DOB: [birth year]  Age: 62 y.o. MRN: [id number]  Chief Complaint  Patient presents with   Acute Visit    Pain behind left eye and having sinus drainage and pressure in both ears    HPI Encounter Diagnoses  Name Primary?   Hypothyroidism due to Hashimoto's thyroiditis Yes   Acute maxillary sinusitis, recurrence not specified    Dysfunction of Eustachian tube, unspecified laterality    Mixed hyperlipidemia    1 week history nasal congestion postnasal drip, ear congestion.  Scant rhinorrhea.  There is facial pressure and discomfort in her teeth.  There is pressure behind her eyes.  There is been no fever or chills, wheezing or difficulty breathing.  Ears are congested.  She has been unable to clear them.   Review of Systems  Constitutional: Negative.   HENT:  Positive for congestion and sinus pain. Negative for ear discharge and ear pain.   Eyes:  Negative for blurred vision, discharge and redness.  Respiratory: Negative.    Cardiovascular: Negative.   Gastrointestinal:  Negative for abdominal pain.  Genitourinary: Negative.   Musculoskeletal: Negative.  Negative for myalgias.  Skin:  Negative for rash.  Neurological:  Negative for tingling, loss of consciousness and weakness.  Endo/Heme/Allergies:  Negative for polydipsia.     Current Outpatient Medications:    albuterol (PROVENTIL HFA;VENTOLIN HFA) 108 (90 Base) MCG/ACT inhaler, Inhale 2 puffs into the lungs every 6 (six) hours as needed for shortness of breath., Disp: 1 Inhaler, Rfl: 0   clarithromycin (BIAXIN XL) 500 MG 24 hr tablet, Take 2 tablets (1,000 mg total) by mouth daily for 10 days., Disp: 20 tablet, Rfl: 0   DULoxetine (CYMBALTA) 60 MG capsule, Take 120 mg by mouth daily. , Disp: , Rfl:    famotidine (PEPCID) 40 MG tablet, TAKE 1 TABLET BY MOUTH EVERY DAY, Disp: 30 tablet, Rfl: 0   hydrocortisone 2.5 % ointment, Apply to areas  on face twice daily, Disp: , Rfl:    predniSONE (DELTASONE) 10 MG tablet, Take 1 tablet (10 mg total) by mouth 2 (two) times daily with a meal for 5 days., Disp: 10 tablet, Rfl: 0   tiZANidine (ZANAFLEX) 4 MG tablet, Take 4 mg by mouth every 8 (eight) hours as needed., Disp: , Rfl:    levothyroxine (SYNTHROID) 150 MCG tablet, TAKE 1 TABLET BY MOUTH EVERY DAY BEFORE BREAKFAST, Disp: 90 tablet, Rfl: 3   Objective:     BP 136/86 (BP Location: Right Arm, Patient Position: Sitting, Cuff Size: Normal)   Pulse 80   Temp 97.8 F (36.6 C) (Tympanic)   Ht '5\' 4"'$  (1.626 m)   Wt 258 lb (117 kg)   LMP 08/16/2011   SpO2 97%   BMI 44.29 kg/m  Wt Readings from Last 3 Encounters:  03/16/22 258 lb (117 kg)  03/04/21 241 lb (109.3 kg)  01/12/21 237 lb 6.4 oz (107.7 kg)      Physical Exam Constitutional:      General: She is not in acute distress.    Appearance: Normal appearance. She is not ill-appearing, toxic-appearing or diaphoretic.  HENT:     Head: Normocephalic and atraumatic.     Right Ear: Ear canal and external ear normal. No middle ear effusion. Tympanic membrane is retracted. Tympanic membrane is not erythematous.     Left Ear: Ear canal and external ear normal.  No middle ear effusion. Tympanic  membrane is retracted. Tympanic membrane is not erythematous.     Mouth/Throat:     Mouth: Mucous membranes are moist.     Pharynx: Oropharynx is clear. No oropharyngeal exudate or posterior oropharyngeal erythema.  Eyes:     General: No scleral icterus.       Right eye: No discharge.        Left eye: No discharge.     Extraocular Movements: Extraocular movements intact.     Conjunctiva/sclera: Conjunctivae normal.     Pupils: Pupils are equal, round, and reactive to light.  Cardiovascular:     Rate and Rhythm: Normal rate and regular rhythm.  Pulmonary:     Effort: Pulmonary effort is normal. No respiratory distress.     Breath sounds: Normal breath sounds. No wheezing or rales.   Musculoskeletal:     Cervical back: No rigidity or tenderness.  Skin:    General: Skin is warm and dry.  Neurological:     Mental Status: She is alert and oriented to person, place, and time.  Psychiatric:        Mood and Affect: Mood normal.        Behavior: Behavior normal.      Results for orders placed or performed in visit on 03/16/22  LDL cholesterol, direct  Result Value Ref Range   Direct LDL 174.0 mg/dL  TSH  Result Value Ref Range   TSH 0.45 0.35 - 5.50 uIU/mL  Comprehensive metabolic panel  Result Value Ref Range   Sodium 142 135 - 145 mEq/L   Potassium 4.2 3.5 - 5.1 mEq/L   Chloride 105 96 - 112 mEq/L   CO2 26 19 - 32 mEq/L   Glucose, Bld 115 (H) 70 - 99 mg/dL   BUN 14 6 - 23 mg/dL   Creatinine, Ser 0.78 0.40 - 1.20 mg/dL   Total Bilirubin 0.3 0.2 - 1.2 mg/dL   Alkaline Phosphatase 77 39 - 117 U/L   AST 12 0 - 37 U/L   ALT 15 0 - 35 U/L   Total Protein 6.6 6.0 - 8.3 g/dL   Albumin 4.3 3.5 - 5.2 g/dL   GFR 81.57 >60.00 mL/min   Calcium 9.6 8.4 - 10.5 mg/dL  CBC  Result Value Ref Range   WBC 6.6 4.0 - 10.5 K/uL   RBC 4.81 3.87 - 5.11 Mil/uL   Platelets 321.0 150.0 - 400.0 K/uL   Hemoglobin 14.1 12.0 - 15.0 g/dL   HCT 41.9 36.0 - 46.0 %   MCV 87.1 78.0 - 100.0 fl   MCHC 33.7 30.0 - 36.0 g/dL   RDW 13.9 11.5 - 15.5 %      The 10-year ASCVD risk score (Arnett DK, et al., 2019) is: 6.5%    Assessment & Plan:   Hypothyroidism due to Hashimoto's thyroiditis -     TSH -     Levothyroxine Sodium; TAKE 1 TABLET BY MOUTH EVERY DAY BEFORE BREAKFAST  Dispense: 90 tablet; Refill: 3  Acute maxillary sinusitis, recurrence not specified -     CBC -     Clarithromycin ER; Take 2 tablets (1,000 mg total) by mouth daily for 10 days.  Dispense: 20 tablet; Refill: 0 -     predniSONE; Take 1 tablet (10 mg total) by mouth 2 (two) times daily with a meal for 5 days.  Dispense: 10 tablet; Refill: 0  Dysfunction of Eustachian tube, unspecified laterality -      predniSONE; Take 1 tablet (10 mg total) by  mouth 2 (two) times daily with a meal for 5 days.  Dispense: 10 tablet; Refill: 0  Mixed hyperlipidemia -     LDL cholesterol, direct -     Comprehensive metabolic panel    Return in about 6 months (around 09/14/2022), or if symptoms worsen or fail to improve.  Information was given to the patient concerning ETD.  Information was given about preventing elevated cholesterol.  Poor experience in the past with elevated LFTs when taking a statin.  Libby Maw, MD

## 2022-03-17 LAB — COMPREHENSIVE METABOLIC PANEL
ALT: 15 U/L (ref 0–35)
AST: 12 U/L (ref 0–37)
Albumin: 4.3 g/dL (ref 3.5–5.2)
Alkaline Phosphatase: 77 U/L (ref 39–117)
BUN: 14 mg/dL (ref 6–23)
CO2: 26 mEq/L (ref 19–32)
Calcium: 9.6 mg/dL (ref 8.4–10.5)
Chloride: 105 mEq/L (ref 96–112)
Creatinine, Ser: 0.78 mg/dL (ref 0.40–1.20)
GFR: 81.57 mL/min (ref >60.00)
Glucose, Bld: 115 mg/dL — ABNORMAL HIGH (ref 70–99)
Potassium: 4.2 mEq/L (ref 3.5–5.1)
Sodium: 142 mEq/L (ref 135–145)
Total Bilirubin: 0.3 mg/dL (ref 0.2–1.2)
Total Protein: 6.6 g/dL (ref 6.0–8.3)

## 2022-03-17 LAB — CBC
HCT: 41.9 % (ref 36.0–46.0)
Hemoglobin: 14.1 g/dL (ref 12.0–15.0)
MCHC: 33.7 g/dL (ref 30.0–36.0)
MCV: 87.1 fl (ref 78.0–100.0)
Platelets: 321 10*3/uL (ref 150.0–400.0)
RBC: 4.81 Mil/uL (ref 3.87–5.11)
RDW: 13.9 % (ref 11.5–15.5)
WBC: 6.6 10*3/uL (ref 4.0–10.5)

## 2022-03-17 LAB — LDL CHOLESTEROL, DIRECT: Direct LDL: 174 mg/dL

## 2022-03-17 LAB — TSH: TSH: 0.45 u[IU]/mL (ref 0.35–5.50)

## 2022-03-17 MED ORDER — LEVOTHYROXINE SODIUM 150 MCG PO TABS: ORAL_TABLET | ORAL | 3 refills | Status: DC

## 2022-03-17 NOTE — Addendum Note (Signed)
Addended by: Abelino Derrick A on: 03/17/2022 01:11 PM   Modules accepted: Orders

## 2022-03-21 ENCOUNTER — Encounter: Payer: Self-pay | Admitting: Family Medicine

## 2022-03-21 LAB — HM MAMMOGRAPHY

## 2022-04-19 ENCOUNTER — Encounter: Payer: Self-pay | Admitting: Gastroenterology

## 2022-06-02 ENCOUNTER — Encounter: Payer: Self-pay | Admitting: Family Medicine

## 2022-06-26 ENCOUNTER — Encounter: Payer: Self-pay | Admitting: Internal Medicine

## 2022-06-26 ENCOUNTER — Encounter: Payer: Self-pay | Admitting: Family Medicine

## 2022-07-06 ENCOUNTER — Ambulatory Visit (AMBULATORY_SURGERY_CENTER): Payer: 59

## 2022-07-06 ENCOUNTER — Encounter: Payer: Self-pay | Admitting: Internal Medicine

## 2022-07-06 VITALS — Ht 64.0 in | Wt 248.0 lb

## 2022-07-06 DIAGNOSIS — Z800 Family history of malignant neoplasm of digestive organs: Secondary | ICD-10-CM

## 2022-07-06 DIAGNOSIS — Z86010 Personal history of colonic polyps: Secondary | ICD-10-CM

## 2022-07-06 MED ORDER — NA SULFATE-K SULFATE-MG SULF 17.5-3.13-1.6 GM/177ML PO SOLN: 1.0000 | Freq: Once | ORAL | 0 refills | Status: AC

## 2022-07-06 NOTE — Progress Notes (Signed)
No egg or soy allergy known to patient  No issues known to pt with past sedation with any surgeries or procedures Patient denies ever being told they had issues or difficulty with intubation  No FH of Malignant Hyperthermia Pt is not on diet pills Pt is not on  home 02  Pt is not on blood thinners  Pt denies issues with constipation  No A fib or A flutter Have any cardiac testing pending--no  Pt is ambulatory   Patient's chart reviewed by Cathlyn Parsons CNRA prior to previsit and patient appropriate for the [facility].  Previsit completed and red dot placed by patient's name on their procedure day (on provider's schedule).      PV completed with patient. Prep instructions reviewed and sent via mychart and to mailing address. Rx sent to the [facility] on file.  Pt instructed to use Singlecare.com or GoodRx for a price reduction on prep . Coupon provided in paperwork.

## 2022-07-14 ENCOUNTER — Encounter: Payer: Self-pay | Admitting: Internal Medicine

## 2022-07-14 ENCOUNTER — Ambulatory Visit (AMBULATORY_SURGERY_CENTER): Payer: 59 | Admitting: Internal Medicine

## 2022-07-14 VITALS — BP 146/75 | HR 71 | Temp 97.1°F | Resp 15 | Ht 64.0 in | Wt 248.0 lb

## 2022-07-14 DIAGNOSIS — Z09 Encounter for follow-up examination after completed treatment for conditions other than malignant neoplasm: Secondary | ICD-10-CM

## 2022-07-14 DIAGNOSIS — D120 Benign neoplasm of cecum: Secondary | ICD-10-CM | POA: Diagnosis not present

## 2022-07-14 DIAGNOSIS — Z86010 Personal history of colonic polyps: Secondary | ICD-10-CM

## 2022-07-14 DIAGNOSIS — Z1211 Encounter for screening for malignant neoplasm of colon: Secondary | ICD-10-CM

## 2022-07-14 DIAGNOSIS — K648 Other hemorrhoids: Secondary | ICD-10-CM

## 2022-07-14 DIAGNOSIS — Z800 Family history of malignant neoplasm of digestive organs: Secondary | ICD-10-CM

## 2022-07-14 MED ORDER — SODIUM CHLORIDE 0.9 % IV SOLN: 500.0000 mL | Freq: Once | INTRAVENOUS | Status: DC

## 2022-07-14 NOTE — Patient Instructions (Signed)
Await pathology results.    Handout on polyps and hemorrhoids provided.  YOU HAD AN ENDOSCOPIC PROCEDURE TODAY AT THE [facility]:   Refer to the procedure report that was given to you for any specific questions about what was found during the examination.  If the procedure report does not answer your questions, please call your gastroenterologist to clarify.  If you requested that your care partner not be given the details of your procedure findings, then the procedure report has been included in a sealed envelope for you to review at your convenience later.  YOU SHOULD EXPECT: Some feelings of bloating in the abdomen. Passage of more gas than usual.  Walking can help get rid of the air that was put into your GI tract during the procedure and reduce the bloating. If you had a lower endoscopy (such as a colonoscopy or flexible sigmoidoscopy) you may notice spotting of blood in your stool or on the toilet paper. If you underwent a bowel prep for your procedure, you may not have a normal bowel movement for a few days.  Please Note:  You might notice some irritation and congestion in your nose or some drainage.  This is from the oxygen used during your procedure.  There is no need for concern and it should clear up in a day or so.  SYMPTOMS TO REPORT IMMEDIATELY:  Following lower endoscopy (colonoscopy or flexible sigmoidoscopy):  Excessive amounts of blood in the stool  Significant tenderness or worsening of abdominal pains  Swelling of the abdomen that is new, acute  Fever of 100F or higher   For urgent or emergent issues, a gastroenterologist can be reached at any hour by calling [phone]. Do not use MyChart messaging for urgent concerns.    DIET:  We do recommend a small meal at first, but then you may proceed to your regular diet.  Drink plenty of fluids but you should avoid alcoholic beverages for 24 hours.  ACTIVITY:  You should plan to take it easy for the rest of  today and you should NOT DRIVE or use heavy machinery until tomorrow (because of the sedation medicines used during the test).    FOLLOW UP: Our staff will call the number listed on your records the next business day following your procedure.  We will call around 7:15- 8:00 am to check on you and address any questions or concerns that you may have regarding the information given to you following your procedure. If we do not reach you, we will leave a message.     If any biopsies were taken you will be contacted by phone or by letter within the next 1-3 weeks.  Please call us at [phone] if you have not heard about the biopsies in 3 weeks.    SIGNATURES/CONFIDENTIALITY: You and/or your care partner have signed paperwork which will be entered into your electronic medical record.  These signatures attest to the fact that that the information above on your After Visit Summary has been reviewed and is understood.  Full responsibility of the confidentiality of this discharge information lies with you and/or your care-partner.  

## 2022-07-14 NOTE — Progress Notes (Signed)
Pt's states no medical or surgical changes since previsit or office visit. 

## 2022-07-14 NOTE — Progress Notes (Signed)
GASTROENTEROLOGY PROCEDURE H&P NOTE   Primary Care Physician: Mliss Sax, MD    Reason for Procedure:   History of colon polyps  Plan:    Colonoscopy  Patient is appropriate for endoscopic procedure(s) in the ambulatory ([facility]) setting.  The nature of the procedure, as well as the risks, benefits, and alternatives were carefully and thoroughly reviewed with the patient. Ample time for discussion and questions allowed. The patient understood, was satisfied, and agreed to proceed.     HPI: Nancy Burch is a 62 y.o. female who presents for colonoscopy for history of colon polyps. Denies blood in stools, changes in bowel habits, or unintentional weight loss. Sister had colon cancer at age 86.  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 treated by [facility] dermatologist 06/16/2015   Hashimoto's thyroiditis 06/16/2015   Hypothyroidism    Primary localized osteoarthritis of right knee 06/16/2015   Pure hypercholesterolemia 06/16/2015    Past Surgical History:  Procedure Laterality Date   BIOPSY THYROID  01/28/2013   CARPOMETACARPAL (CMC) FUSION OF THUMB     CESAREAN SECTION  1985, 1989   COLONOSCOPY     TOTAL KNEE ARTHROPLASTY Right 06/28/2015   Procedure: TOTAL KNEE ARTHROPLASTY;  Surgeon: Salvatore Marvel, MD;  Location: [facility] OR;  Service: Orthopedics;  Laterality: Right;   vertebrae fusion     02-26-2019    x4   WISDOM TOOTH EXTRACTION      Prior to Admission medications   Medication Sig Start Date End Date Taking? Authorizing Provider  DULoxetine (CYMBALTA) 60 MG capsule Take 120 mg by mouth daily.    Yes [provider]  famotidine (PEPCID) 40 MG tablet TAKE 1 TABLET BY MOUTH EVERY DAY 06/28/20  Yes Mliss Sax, MD  levothyroxine (SYNTHROID) 150 MCG tablet TAKE 1 TABLET BY MOUTH EVERY DAY BEFORE BREAKFAST 03/17/22  Yes  Mliss Sax, MD  tiZANidine (ZANAFLEX) 4 MG tablet Take 4 mg by mouth every 8 (eight) hours as needed. 11/11/20  Yes [provider]  albuterol (PROVENTIL HFA;VENTOLIN HFA) 108 (90 Base) MCG/ACT inhaler Inhale 2 puffs into the lungs every 6 (six) hours as needed for shortness of breath. 05/16/18   [place], Everardo All, FNP  diclofenac (VOLTAREN) 75 MG EC tablet Take 75 mg by mouth 2 (two) times daily as needed. 06/25/20   [provider]  DUPIXENT 300 MG/2ML SOPN Inject 300 mg into the skin every 14 (fourteen) days. 05/10/22   [provider]  hydrocortisone 2.5 % ointment Apply to areas on face twice daily 11/30/21   [provider]    Current Outpatient Medications  Medication Sig Dispense Refill   DULoxetine (CYMBALTA) 60 MG capsule Take 120 mg by mouth daily.      famotidine (PEPCID) 40 MG tablet TAKE 1 TABLET BY MOUTH EVERY DAY 30 tablet 0   levothyroxine (SYNTHROID) 150 MCG tablet TAKE 1 TABLET BY MOUTH EVERY DAY BEFORE BREAKFAST 90 tablet 3   tiZANidine (ZANAFLEX) 4 MG tablet Take 4 mg by mouth every 8 (eight) hours as needed.     albuterol (PROVENTIL HFA;VENTOLIN HFA) 108 (90 Base) MCG/ACT inhaler Inhale 2 puffs into the lungs every 6 (six) hours as needed for shortness of breath. 1 Inhaler 0   diclofenac (VOLTAREN) 75 MG EC tablet Take 75 mg by mouth 2 (  two) times daily as needed.     DUPIXENT 300 MG/2ML SOPN Inject 300 mg into the skin every 14 (fourteen) days.     hydrocortisone 2.5 % ointment Apply to areas on face twice daily     Current Facility-Administered Medications  Medication Dose Route Frequency Provider Last Rate Last Admin   0.9 %  sodium chloride infusion  500 mL Intravenous Once Imogene Burn, MD        Allergies as of 07/14/2022 - Review Complete 07/14/2022  Allergen Reaction Noted   Penicillins Hives 10/12/2011    Family History  Problem Relation Age of Onset   Breast cancer Mother    Thyroid disease Mother    Melanoma  Father    Diabetes Sister    Colon cancer Sister 11   Thyroid disease Sister    Diabetes Sister    Diabetes Sister    Diabetes Sister    Stomach cancer Neg Hx    Esophageal cancer Neg Hx    Rectal cancer Neg Hx     Social History   Socioeconomic History   Marital status: Married    Spouse name: Ledon Snare   Number of children: Not on file   Years of education: Not on file   Highest education level: Not on file  Occupational History   Occupation: controller  Tobacco Use   Smoking status: Former    Types: Cigarettes    Quit date: 06/27/2005    Years since quitting: 17.0   Smokeless tobacco: Never  Vaping Use   Vaping Use: Never used  Substance and Sexual Activity   Alcohol use: Yes    Alcohol/week: 2.0 standard drinks of alcohol    Types: 2 Glasses of wine per week    Comment: 2 galsses per month   Drug use: No   Sexual activity: Yes  Other Topics Concern   Not on file  Social History Narrative   Not on file   Social Determinants of Health   Financial Resource Strain: Not on file  Food Insecurity: Not on file  Transportation Needs: Not on file  Physical Activity: Not on file  Stress: Not on file  Social Connections: Not on file  Intimate Partner Violence: Not on file    Physical Exam: Vital signs in last 24 hours: BP (!) 153/86   Pulse 75   Temp (!) 97.1 F (36.2 C) (Temporal)   Ht 5\' 4"  (1.626 m)   Wt 248 lb (112.5 kg)   LMP 08/16/2011   SpO2 96%   BMI 42.57 kg/m  GEN: NAD EYE: Sclerae anicteric ENT: MMM CV: Non-tachycardic Pulm: No increased work of breathing GI: Soft, NT/ND NEURO:  Alert & Oriented   Eulah Pont, MD [facility]  07/14/2022 10:48 AM

## 2022-07-14 NOTE — Op Note (Signed)
[facility] Patient Name: Nancy Burch Procedure Date: 07/14/2022 11:09 AM MRN: [id number] Endoscopist: Madelyn Brunner Bivins , , [id number] Age: 62 Referring MD:  Date of Birth: 1960-06-01 Gender: Female Account #: [id number] Procedure:                Colonoscopy Indications:              Family history of colon cancer in a first-degree                            relative before age 78 years Medicines:                Monitored Anesthesia Care Procedure:                Pre-Anesthesia Assessment:                           - Prior to the procedure, a History and Physical                            was performed, and patient medications and                            allergies were reviewed. The patient's tolerance of                            previous anesthesia was also reviewed. The risks                            and benefits of the procedure and the sedation                            options and risks were discussed with the patient.                            All questions were answered, and informed consent                            was obtained. Prior Anticoagulants: The patient has                            taken no anticoagulant or antiplatelet agents. ASA                            Grade Assessment: III - A patient with severe                            systemic disease. After reviewing the risks and                            benefits, the patient was deemed in satisfactory                            condition to undergo the procedure.  After obtaining informed consent, the colonoscope                            was passed under direct vision. Throughout the                            procedure, the patient's blood pressure, pulse, and                            oxygen saturations were monitored continuously. The                            CF [id number] #[id number] was introduced through the anus                            and advanced to the  the cecum, identified by                            appendiceal orifice and ileocecal valve. The                            colonoscopy was performed without difficulty. The                            patient tolerated the procedure well. The quality                            of the bowel preparation was good. The ileocecal                            valve, appendiceal orifice, and rectum were                            photographed. Scope In: 11:20:54 AM Scope Out: 11:37:41 AM Scope Withdrawal Time: 0 hours 10 minutes 22 seconds  Total Procedure Duration: 0 hours 16 minutes 47 seconds  Findings:                 The terminal ileum appeared normal.                           A 3 mm polyp was found in the cecum. The polyp was                            sessile. The polyp was removed with a cold snare.                            Resection and retrieval were complete.                           Non-bleeding internal hemorrhoids were found during                            retroflexion. Complications:  No immediate complications. Estimated Blood Loss:     Estimated blood loss was minimal. Impression:               - The examined portion of the ileum was normal.                           - One 3 mm polyp in the cecum, removed with a cold                            snare. Resected and retrieved.                           - Non-bleeding internal hemorrhoids. Recommendation:           - Discharge patient to home (with escort).                           - Await pathology results.                           - Repeat colonoscopy in 5 years for surveillance.                           - The findings and recommendations were discussed                            with the patient. Dr Particia Lather "Alan Ripper" Leonides Schanz,  07/14/2022 11:41:13 AM

## 2022-07-14 NOTE — Progress Notes (Signed)
Uneventful anesthetic. Report to pacu rn. Vss. Care resumed by rn. 

## 2022-07-14 NOTE — Progress Notes (Signed)
Called to room to assist during endoscopic procedure.  Patient ID and intended procedure confirmed with present staff. Received instructions for my participation in the procedure from the performing physician.  

## 2022-07-17 ENCOUNTER — Telehealth: Payer: Self-pay

## 2022-07-17 NOTE — Telephone Encounter (Signed)
  Follow up Call-     07/14/2022   10:29 AM  Call back number  Post procedure Call Back phone  # [phone]  Permission to leave phone message Yes     Patient questions:  Do you have a fever, pain , or abdominal swelling? No. Pain Score  0 *  Have you tolerated food without any problems? Yes.    Have you been able to return to your normal activities? Yes.    Do you have any questions about your discharge instructions: Diet   No. Medications  No. Follow up visit  No.  Do you have questions or concerns about your Care? No.  Actions: * If pain score is 4 or above: No action needed, pain <4.

## 2022-07-18 ENCOUNTER — Encounter: Payer: Self-pay | Admitting: Internal Medicine

## 2022-10-10 ENCOUNTER — Encounter: Payer: Self-pay | Admitting: Family Medicine

## 2022-10-11 NOTE — Telephone Encounter (Signed)
LVMTRC. We do not have a notary on site in the clinic to notarize the form the patient needs completed.

## 2022-10-11 NOTE — Telephone Encounter (Signed)
PAPERWORK/FORMS received  Dropped off by: Sent via [facility] 10/10/22 Call back #: [phone] Individual made aware of 3-5 business day turn around (YES/NO): Y GREEN charge sheet completed and patient made aware of possible charge (YES/NO): N/A Placed in provider folder at front desk. ~~~ route to CMA/provider Team. Routed to [employer] and Document placed in [place] folder in FO on 10/11/22.  CLINICAL USE BELOW THIS LINE (use X to signify action taken)  ___ Form received and placed in providers office for signature. ___ Form completed and faxed to LOA Dept.  ___ Form completed & LVM to notify patient ready for pick up.  ___ Charge sheet and copy of form in front office folder for office supervisor.

## 2022-10-12 NOTE — Telephone Encounter (Signed)
LVMTRC. 2nd attempt to contact patient and inform her that we do not have a notary onsite at the office to witness Dr. Evangeline Burch signature on her [employer] form.

## 2022-10-16 NOTE — Telephone Encounter (Signed)
LVMTRC. 3rd attempt to contact patient and inform her that we do not have a notary onsite at the office to witness Dr. Evangeline Gula signature on her [employer] form.

## 2022-10-18 ENCOUNTER — Encounter: Payer: Self-pay | Admitting: Family Medicine

## 2022-10-18 ENCOUNTER — Ambulatory Visit: Payer: 59 | Admitting: Family Medicine

## 2022-10-18 VITALS — BP 126/72 | HR 83 | Temp 98.0°F | Ht 64.0 in | Wt 262.0 lb

## 2022-10-18 DIAGNOSIS — E782 Mixed hyperlipidemia: Secondary | ICD-10-CM

## 2022-10-18 DIAGNOSIS — R911 Solitary pulmonary nodule: Secondary | ICD-10-CM | POA: Diagnosis not present

## 2022-10-18 DIAGNOSIS — M545 Low back pain, unspecified: Secondary | ICD-10-CM

## 2022-10-18 DIAGNOSIS — E038 Other specified hypothyroidism: Secondary | ICD-10-CM

## 2022-10-18 DIAGNOSIS — E063 Autoimmune thyroiditis: Secondary | ICD-10-CM

## 2022-10-18 DIAGNOSIS — Z5320 Procedure and treatment not carried out because of patient's decision for unspecified reasons: Secondary | ICD-10-CM

## 2022-10-18 DIAGNOSIS — Z131 Encounter for screening for diabetes mellitus: Secondary | ICD-10-CM

## 2022-10-18 LAB — COMPREHENSIVE METABOLIC PANEL
ALT: 20 U/L (ref 0–35)
AST: 13 U/L (ref 0–37)
Albumin: 4.4 g/dL (ref 3.5–5.2)
Alkaline Phosphatase: 96 U/L (ref 39–117)
BUN: 21 mg/dL (ref 6–23)
CO2: 27 mEq/L (ref 19–32)
Calcium: 9.9 mg/dL (ref 8.4–10.5)
Chloride: 106 meq/L (ref 96–112)
Creatinine, Ser: 0.71 mg/dL (ref 0.40–1.20)
GFR: 90.94 mL/min (ref >60.00)
Glucose, Bld: 121 mg/dL — ABNORMAL HIGH (ref 70–99)
Potassium: 4.5 meq/L (ref 3.5–5.1)
Sodium: 142 mEq/L (ref 135–145)
Total Bilirubin: 0.4 mg/dL (ref 0.2–1.2)
Total Protein: 6.8 g/dL (ref 6.0–8.3)

## 2022-10-18 LAB — LDL CHOLESTEROL, DIRECT: Direct LDL: 211 mg/dL

## 2022-10-18 LAB — CBC
HCT: 42.1 % (ref 36.0–46.0)
Hemoglobin: 13.9 g/dL (ref 12.0–15.0)
MCHC: 32.9 g/dL (ref 30.0–36.0)
MCV: 88 fl (ref 78.0–100.0)
Platelets: 320 10*3/uL (ref 150.0–400.0)
RBC: 4.79 Mil/uL (ref 3.87–5.11)
RDW: 13.7 % (ref 11.5–15.5)
WBC: 7.2 10*3/uL (ref 4.0–10.5)

## 2022-10-18 LAB — HEMOGLOBIN A1C: Hgb A1c MFr Bld: 5.6 % (ref 4.6–6.5)

## 2022-10-18 NOTE — Progress Notes (Signed)
Established Patient Office Visit   Subjective:  Patient ID: Nancy Burch, female    DOB: 02-Jun-1960  Age: 62 y.o. MRN: [id number]  No chief complaint on file.   HPI Encounter Diagnoses  Name Primary?   Mixed hyperlipidemia Yes   Morbid obesity (HCC)    Pulmonary nodule    Hypothyroidism due to Hashimoto's thyroiditis    Low back pain, unspecified back pain laterality, unspecified chronicity, unspecified whether sciatica present    Screening for diabetes mellitus    Statin declined    For follow-up of above.  Nonfasting today.  Lost to follow-up for elevated LDL cholesterol.  She reports an elevation in her liver enzymes seen in another clinic.  Liver enzymes have been normal here.  She has tried multiple diets for weight loss including GLP-1 agonist.  Follow-up is scheduled with her weight loss provider.  Ongoing follow-up with her back doctor for lumbar pain.  He is up-to-date on health maintenance.  Follow-up of hypothyroidism.  He is concerned that her T3 levels may be low due to non conversion of T4-T3.   Review of Systems  Constitutional: Negative.   HENT: Negative.    Eyes:  Negative for blurred vision, discharge and redness.  Respiratory: Negative.    Cardiovascular: Negative.   Gastrointestinal:  Negative for abdominal pain.  Genitourinary: Negative.   Musculoskeletal:  Positive for back pain. Negative for myalgias.  Skin:  Negative for rash.  Neurological:  Negative for tingling, loss of consciousness and weakness.  Endo/Heme/Allergies:  Negative for polydipsia.     Current Outpatient Medications:    albuterol (PROVENTIL HFA;VENTOLIN HFA) 108 (90 Base) MCG/ACT inhaler, Inhale 2 puffs into the lungs every 6 (six) hours as needed for shortness of breath., Disp: 1 Inhaler, Rfl: 0   DULoxetine (CYMBALTA) 60 MG capsule, Take 120 mg by mouth daily. , Disp: , Rfl:    DUPIXENT 300 MG/2ML SOPN, Inject 300 mg into the skin every 14 (fourteen) days., Disp: , Rfl:     famotidine (PEPCID) 40 MG tablet, TAKE 1 TABLET BY MOUTH EVERY DAY, Disp: 30 tablet, Rfl: 0   hydrocortisone 2.5 % ointment, Apply to areas on face twice daily, Disp: , Rfl:    levothyroxine (SYNTHROID) 150 MCG tablet, TAKE 1 TABLET BY MOUTH EVERY DAY BEFORE BREAKFAST, Disp: 90 tablet, Rfl: 3   predniSONE (STERAPRED UNI-PAK 21 TAB) 10 MG (21) TBPK tablet, Take by mouth., Disp: , Rfl:    diclofenac (VOLTAREN) 75 MG EC tablet, Take 75 mg by mouth 2 (two) times daily as needed., Disp: , Rfl:    tiZANidine (ZANAFLEX) 4 MG tablet, Take 4 mg by mouth every 8 (eight) hours as needed., Disp: , Rfl:    Objective:     BP 126/72   Pulse 83   Temp 98 F (36.7 C)   Ht 5\' 4"  (1.626 m)   Wt 262 lb (118.8 kg)   LMP 08/16/2011   SpO2 97%   BMI 44.97 kg/m  Wt Readings from Last 3 Encounters:  10/18/22 262 lb (118.8 kg)  07/14/22 248 lb (112.5 kg)  07/06/22 248 lb (112.5 kg)      Physical Exam Constitutional:      General: She is not in acute distress.    Appearance: Normal appearance. She is obese. She is not ill-appearing, toxic-appearing or diaphoretic.  HENT:     Head: Normocephalic and atraumatic.     Right Ear: External ear normal.     Left Ear: External ear  normal.     Mouth/Throat:     Mouth: Mucous membranes are moist.     Pharynx: Oropharynx is clear. No oropharyngeal exudate or posterior oropharyngeal erythema.  Eyes:     General: No scleral icterus.       Right eye: No discharge.        Left eye: No discharge.     Extraocular Movements: Extraocular movements intact.     Conjunctiva/sclera: Conjunctivae normal.     Pupils: Pupils are equal, round, and reactive to light.  Cardiovascular:     Rate and Rhythm: Normal rate and regular rhythm.  Pulmonary:     Effort: Pulmonary effort is normal. No respiratory distress.     Breath sounds: Normal breath sounds.  Abdominal:     General: Bowel sounds are normal.  Musculoskeletal:     Cervical back: No rigidity or tenderness.   Skin:    General: Skin is warm and dry.  Neurological:     Mental Status: She is alert and oriented to person, place, and time.  Psychiatric:        Mood and Affect: Mood normal.        Behavior: Behavior normal.      No results found for any visits on 10/18/22.    The 10-year ASCVD risk score (Arnett DK, et al., 2019) is: 5.6%    Assessment & Plan:   Mixed hyperlipidemia -     LDL cholesterol, direct  Morbid obesity (HCC)  Pulmonary nodule -     CT CHEST NODULE FOLLOW UP WO CONTRAST; Future  Hypothyroidism due to Hashimoto's thyroiditis -     CBC -     TSH -     Ambulatory referral to Endocrinology -     T3, free  Low back pain, unspecified back pain laterality, unspecified chronicity, unspecified whether sciatica present  Screening for diabetes mellitus -     Comprehensive metabolic panel -     Hemoglobin A1c  Statin declined    Return in about 6 months (around 04/20/2023).  She is currently seeing a weight loss provider in [place].  Suggested that she may consider referral to a bariatric clinic.  She will discuss this with her weight loss provider.  Information was given on calorie counting for weight loss.  She is hesitant to take statins because of a reported elevation in her liver enzymes.  Explained that her liver enzymes have always been normal here.  Again expressed my concern over her elevated risk for vascular disease with her elevated LDL cholesterol.  Information was given on preventing high cholesterol and dyslipidemia.  Follow-up screening CT for lung nodule follow-up.  Ongoing follow-up with her back doctor.  She is concerned that her T3 levels could be low.  Mliss Sax, MD

## 2022-10-20 LAB — T3, FREE: T3, Free: 3.4 pg/mL (ref 2.3–4.2)

## 2022-10-20 LAB — TSH: TSH: 0.25 u[IU]/mL — ABNORMAL LOW (ref 0.35–5.50)

## 2022-10-24 NOTE — Telephone Encounter (Signed)
Paperwork given to pt 10/18/22 during OV. Pt had Dr. Doreene Burke sign form during visit on video to take to notary.

## 2022-10-27 ENCOUNTER — Encounter: Payer: Self-pay | Admitting: Family Medicine

## 2022-11-01 ENCOUNTER — Other Ambulatory Visit: Payer: Self-pay | Admitting: Nurse Practitioner

## 2022-11-01 DIAGNOSIS — E063 Autoimmune thyroiditis: Secondary | ICD-10-CM

## 2022-11-08 ENCOUNTER — Ambulatory Visit (INDEPENDENT_AMBULATORY_CARE_PROVIDER_SITE_OTHER): Payer: 59 | Admitting: Nurse Practitioner

## 2022-11-08 ENCOUNTER — Encounter: Payer: Self-pay | Admitting: Nurse Practitioner

## 2022-11-08 VITALS — BP 148/79 | HR 88 | Temp 98.6°F | Ht 64.0 in | Wt 260.0 lb

## 2022-11-08 DIAGNOSIS — E063 Autoimmune thyroiditis: Secondary | ICD-10-CM

## 2022-11-08 DIAGNOSIS — E038 Other specified hypothyroidism: Secondary | ICD-10-CM | POA: Diagnosis not present

## 2022-11-08 DIAGNOSIS — Z6841 Body Mass Index (BMI) 40.0 and over, adult: Secondary | ICD-10-CM | POA: Diagnosis not present

## 2022-11-08 DIAGNOSIS — Z0289 Encounter for other administrative examinations: Secondary | ICD-10-CM

## 2022-11-08 NOTE — Progress Notes (Signed)
Office: [phone]  /  Fax: [phone]   Initial Visit  Nancy Burch was seen in clinic today to evaluate for obesity. She is interested in losing weight to improve overall health and reduce the risk of weight related complications. She presents today to review program treatment options, initial physical assessment, and evaluation.     She was referred by: Self-Referral  When asked what else they would like to accomplish? She states: Improve energy levels and physical activity, Improve existing medical conditions, and Lose a target amount of weight : 100 lbs weight loss  Would like to lose weight to have left knee surgery  Weight history:  She was "maybe overweight" as a child.  She started gaining excess weight after her second child and more after being diagnosed with Hashimoto's.   When asked how has your weight affected you? She states: Contributed to orthopedic problems or mobility issues, Having fatigue, and Having poor endurance  Some associated conditions: She has co morbidities of acquired autoimmune hypothyroidism, knee arthritis (total knee 2017), depression, hyperlipidemia, asthma, pulmonary nodule, GERD, back pain (surgery 2021)   Contributing factors: Family history, Stress, Life event, Pregnancy, and Menopause  Weight promoting medications identified: None  Current nutrition plan: Portion control / smart choices  Current level of physical activity: None  Current or previous pharmacotherapy: Wegovy (side effects-vomiting), Saxenda, Vit B12 injections  Response to medication: Lost weight initially but was unable to sustain weight loss   Past medical history includes:   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  Peak Weight:260 lbs  , Body Fat%:51.6, Visceral Fat Rating:18, Weight trend over the last 12 months: Increasing  General:  Alert, oriented and cooperative. Patient is in no acute distress.  Respiratory: Normal respiratory effort, no problems with respiration noted   Gait: able to ambulate independently  Mental Status: Normal mood and affect. Normal behavior. Normal judgment and thought content.   DIAGNOSTIC DATA REVIEWED:  BMET    Component Value Date/Time   NA 142 10/18/2022 1123   NA 143 04/05/2017 0000   K 4.5 10/18/2022 1123   CL 106 10/18/2022 1123   CO2 27 10/18/2022 1123   GLUCOSE 121 (H) 10/18/2022 1123   BUN 21 10/18/2022 1123   BUN 15 04/05/2017 0000   CREATININE 0.71 10/18/2022 1123   CALCIUM 9.9 10/18/2022 1123   GFRNONAA >60 06/30/2015 0344   GFRAA >60 06/30/2015 0344   Lab Results  Component Value Date   HGBA1C 5.6 10/18/2022   HGBA1C 5.2 04/05/2017   No results found for: "INSULIN" CBC    Component Value Date/Time   WBC 7.2 10/18/2022 1123   RBC 4.79 10/18/2022 1123   HGB 13.9 10/18/2022 1123   HCT 42.1 10/18/2022 1123   PLT 320.0 10/18/2022 1123   MCV 88.0  10/18/2022 1123   MCH 29.6 06/30/2015 0344   MCHC 32.9 10/18/2022 1123   RDW 13.7 10/18/2022 1123   Iron/TIBC/Ferritin/ %Sat No results found for: "IRON", "TIBC", "FERRITIN", "IRONPCTSAT" Lipid Panel     Component Value Date/Time   CHOL 267 (H) 03/04/2021 0942   TRIG 205.0 (H) 03/04/2021 0942   HDL 41.90 03/04/2021 0942   CHOLHDL 6 03/04/2021 0942   VLDL 41.0 (H) 03/04/2021 0942   LDLCALC 175 04/05/2017 0000   LDLDIRECT 211.0 10/18/2022 1123   Hepatic Function Panel      Component Value Date/Time   PROT 6.8 10/18/2022 1123   ALBUMIN 4.4 10/18/2022 1123   AST 13 10/18/2022 1123   ALT 20 10/18/2022 1123   ALKPHOS 96 10/18/2022 1123   BILITOT 0.4 10/18/2022 1123   BILIDIR 0.11 04/05/2017 0000      Component Value Date/Time   TSH 0.25 (L) 10/18/2022 1123     Assessment and Plan:   Hypothyroidism due to Hashimoto's thyroiditis Continue to follow up with PCP. Continue meds as directed  Morbid obesity (HCC)  BMI 40.0-44.9, adult (HCC)        Obesity Treatment / Action Plan:  Patient will work on garnering support from family and friends to begin weight loss journey. Will work on eliminating or reducing the presence of highly palatable, calorie dense foods in the home. Will complete provided nutritional and psychosocial assessment questionnaire before the next appointment. Will be scheduled for indirect calorimetry to determine resting energy expenditure in a fasting state.  This will allow [place] to create a reduced calorie, high-protein meal plan to promote loss of fat mass while preserving muscle mass. Counseled on the health benefits of losing 5%-15% of total body weight. Was counseled on nutritional approaches to weight loss and benefits of reducing processed foods and consuming plant-based foods and high quality protein as part of nutritional weight management. Was counseled on pharmacotherapy and role as an adjunct in weight management.   Obesity Education Performed Today:  She was weighed on the bioimpedance scale and results were discussed and documented in the synopsis.  We discussed obesity as a disease and the importance of a more detailed evaluation of all the factors contributing to the disease.  We discussed the importance of long term lifestyle changes which include nutrition, exercise and behavioral modifications as well as the importance of customizing this to her specific health and social needs.  We discussed the benefits of  reaching a healthier weight to alleviate the symptoms of existing conditions and reduce the risks of the biomechanical, metabolic and psychological effects of obesity.  Nancy Burch appears to be in the action stage of change and states they are ready to start intensive lifestyle modifications and behavioral modifications.  30 minutes was spent today on this visit including the above counseling, pre-visit chart review, and post-visit documentation.  Reviewed by clinician on day of visit: allergies, medications, problem list, medical history, surgical history, family history, social history, and previous encounter notes pertinent to obesity diagnosis.    Theodis Sato Khailee Mick FNP-C

## 2022-11-25 ENCOUNTER — Ambulatory Visit (HOSPITAL_BASED_OUTPATIENT_CLINIC_OR_DEPARTMENT_OTHER): Payer: 59

## 2022-11-27 ENCOUNTER — Ambulatory Visit (HOSPITAL_BASED_OUTPATIENT_CLINIC_OR_DEPARTMENT_OTHER)
Admission: RE | Admit: 2022-11-27 | Discharge: 2022-11-27 | Disposition: A | Discharge status: Home / Self Care | Payer: 59 | Source: Ambulatory Visit | Attending: Family Medicine | Admitting: Family Medicine

## 2022-11-27 ENCOUNTER — Ambulatory Visit
Admission: RE | Admit: 2022-11-27 | Discharge: 2022-11-27 | Disposition: A | Discharge status: Home / Self Care | Payer: 59 | Source: Ambulatory Visit | Attending: Nurse Practitioner

## 2022-11-27 DIAGNOSIS — R911 Solitary pulmonary nodule: Secondary | ICD-10-CM | POA: Diagnosis present

## 2022-11-27 DIAGNOSIS — E063 Autoimmune thyroiditis: Secondary | ICD-10-CM

## 2022-11-28 ENCOUNTER — Ambulatory Visit: Payer: 59 | Admitting: Bariatrics

## 2022-12-04 NOTE — Addendum Note (Signed)
Addended by: Andrez Grime on: 12/04/2022 11:58 AM   Modules accepted: Orders

## 2022-12-13 ENCOUNTER — Ambulatory Visit: Payer: 59 | Admitting: Bariatrics

## 2023-05-14 ENCOUNTER — Ambulatory Visit: Admitting: Bariatrics

## 2023-05-14 ENCOUNTER — Encounter: Payer: Self-pay | Admitting: Bariatrics

## 2023-05-14 VITALS — BP 130/86 | HR 100 | Temp 98.2°F | Ht 64.0 in | Wt 254.0 lb

## 2023-05-14 DIAGNOSIS — E66813 Morbid (severe) obesity due to excess calories: Secondary | ICD-10-CM

## 2023-05-14 DIAGNOSIS — E069 Thyroiditis, unspecified: Secondary | ICD-10-CM | POA: Diagnosis not present

## 2023-05-14 DIAGNOSIS — E538 Deficiency of other specified B group vitamins: Secondary | ICD-10-CM | POA: Diagnosis not present

## 2023-05-14 DIAGNOSIS — R7309 Other abnormal glucose: Secondary | ICD-10-CM

## 2023-05-14 DIAGNOSIS — E785 Hyperlipidemia, unspecified: Secondary | ICD-10-CM

## 2023-05-14 DIAGNOSIS — Z5320 Procedure and treatment not carried out because of patient's decision for unspecified reasons: Secondary | ICD-10-CM

## 2023-05-14 DIAGNOSIS — E782 Mixed hyperlipidemia: Secondary | ICD-10-CM

## 2023-05-14 DIAGNOSIS — R5383 Other fatigue: Secondary | ICD-10-CM

## 2023-05-14 DIAGNOSIS — R0602 Shortness of breath: Secondary | ICD-10-CM | POA: Diagnosis not present

## 2023-05-14 DIAGNOSIS — E559 Vitamin D deficiency, unspecified: Secondary | ICD-10-CM | POA: Diagnosis not present

## 2023-05-14 DIAGNOSIS — E063 Autoimmune thyroiditis: Secondary | ICD-10-CM

## 2023-05-14 DIAGNOSIS — Z6841 Body Mass Index (BMI) 40.0 and over, adult: Secondary | ICD-10-CM

## 2023-05-14 DIAGNOSIS — Z1331 Encounter for screening for depression: Secondary | ICD-10-CM

## 2023-05-14 NOTE — Progress Notes (Signed)
 At a Glance:  Vitals Temp: 98.2 F (36.8 C) BP: 130/86 Pulse Rate: 100 SpO2: 98 %   Anthropometric Measurements Height: 5\' 4"  (1.626 m) Weight: 254 lb (115.2 kg) BMI (Calculated): 43.58 Starting Weight: 254lb Waist Measurement : 50 inches   Body Composition  Body Fat %: 50.7 % Fat Mass (lbs): 128.8 lbs Muscle Mass (lbs): 119 lbs Total Body Water (lbs): 88 lbs Visceral Fat Rating : 18   Other Clinical Data RMR: 2218 Fasting: Yes Labs: Yes Today's Visit #: 1 Starting Date: 05/14/23    EKG: Normal sinus rhythm, rate 97.  Indirect Calorimeter:   Resting Metabolic Rate ( RMR):  RMR (actual): 2218 kcal RMR (calculated): 1791 kcal The calculated basal metabolic rate is 6578 kcal thus her basal metabolic rate is better than expected.  Plan:   Indirect calorimeter completed, interpreted and reviewed with patient today and allowed to ask questions.  Discussed the implications for the chosen plan and exercise based on the RMR reading.  Will consider repeating the RMR in the future based on weight loss.    Chief Complaint:  Obesity   Subjective:  Nancy Burch (MR# [id number]) is a 63 y.o. female who presents for evaluation and treatment of obesity and related comorbidities.   [name] is currently in the action stage of change and ready to dedicate time achieving and maintaining a healthier weight. [name] is interested in becoming our patient and working on intensive lifestyle modifications including (but not limited to) diet and exercise for weight loss.  [name] has been struggling with her weight. She has been unsuccessful in either losing weight, maintaining weight loss, or reaching her healthy weight goal.  [name]'s habits were reviewed today and are as follows: Her family eats meals together, she thinks her family will eat healthier with her, she has been heavy most of her life, she started gaining weight as a teenager, and she skips meals frequently.   She  started gaining weight as a teenager.   Current or previous pharmacotherapy: GLP-1 and Other: B12, Saxenda, and Wegovy   Response to medication: Lost weight initially but was unable to sustain weight loss  Other Fatigue [name] admits to daytime somnolence and admits to waking up still tired. Patient does not have a history of symptoms of sleep apnea.  [name] generally gets 6 or 7 hours of sleep per night, and states that she has generally restful sleep. Snoring is present. Apneic episodes is not present. Epworth Sleepiness Score is 5.   Shortness of Breath [name] notes increasing shortness of breath with exercising and seems to be worsening over time with weight gain. She notes getting out of breath sooner with activity than she used to. This has not gotten worse recently. [name] denies shortness of breath at rest or orthopnea.  Depression Screen [name]'s Food and Mood (modified PHQ-9) score was 5. 5-9 mild depression     03/16/2022    2:40 PM  Depression screen PHQ 2/9  Decreased Interest 0  Down, Depressed, Hopeless 0  PHQ - 2 Score 0     Assessment and Plan:   Other Fatigue [name] does feel that her weight is causing her energy to be lower than it should be. Fatigue may be related to obesity, depression or many other causes. Labs will be ordered, and in the meanwhile, Kameren will focus on self care including making healthy food choices, increasing physical activity and focusing on stress reduction.  Shortness of Breath [name] does feel that she gets  out of breath more easily that she used to when she exercises. [name]'s shortness of breath appears to be obesity related and exercise induced. She has agreed to work on weight loss and gradually increase exercise to treat her exercise induced shortness of breath. Will continue to monitor closely.  Health Maintenance:   Obesity   Plan: Will do EKG, indirect calorimetry, and labs.     Vitamin D Deficiency Vitamin D is at goal of 50.   Most recent vitamin D level was 59.1. She is at risk for vitamin D deficiency due to obesity.  She is not on vitamin D.  Lab Results  Component Value Date   VD25OH 59.1 04/05/2017   VD25OH 65.5 01/04/2017   VD25OH 42.5 09/27/2015    Plan: Will continue on vitamin D.  Will recheck vitamin D in the future.   [name] had a positive depression screening. Depression is commonly associated with obesity and often results in emotional eating behaviors. We will monitor this closely and work on CBT to help improve the non-hunger eating patterns. Referral to Psychology may be required if no improvement is seen as she continues in our clinic.   Hypothyroidism Stable.  Does not report symptoms associated with uncontrolled hypothyroidism. Medication(s): Levothyroxine 137 mcg daily. She has had recent lab work which was normal.  Lab Results  Component Value Date   TSH 0.25 (L) 10/18/2022    Plan: Continue levothyroxine at current dose. Counseling: The correct way to take levothyroxine is fasting, with water, separated by at least 30 minutes from breakfast, and separated by more than 4 hours from calcium, iron, multivitamins, acid reflux medications (PPIs).   Hyperlipidemia LDL is not at goal. She has declined a statin.  Medication(s): Levothyroxine.  Cardiovascular risk factors: dyslipidemia, hypertension, obesity (BMI >= 30 kg/m2), and sedentary lifestyle  Lab Results  Component Value Date   CHOL 267 (H) 03/04/2021   HDL 41.90 03/04/2021   LDLCALC 175 04/05/2017   LDLDIRECT 211.0 10/18/2022   TRIG 205.0 (H) 03/04/2021   CHOLHDL 6 03/04/2021   Lab Results  Component Value Date   ALT 20 10/18/2022   AST 13 10/18/2022   ALKPHOS 96 10/18/2022   BILITOT 0.4 10/18/2022   The 10-year ASCVD risk score (Arnett DK, et al., 2019) is: 6.4%   Values used to calculate the score:     Age: 59 years     Sex: Female     Is Non-Hispanic African American: No     Diabetic: No     Tobacco smoker:  No     Systolic Blood Pressure: 130 mmHg     Is BP treated: No     HDL Cholesterol: 41.9 mg/dL     Total Cholesterol: 267 mg/dL  Plan:  Continue statin.  Information sheet on healthy vs unhealthy fats.  Will avoid all trans fats.  Will read labels Will minimize saturated fats except the following: low fat meats in moderation, diary, and limited dark chocolate.  Increase Omega 3 in foods, and consider an Omega 3 supplement.    B 12 deficiency:   She is not taking B12 (oral/sublingual). She has risk factors for B 12 deficiency.   Plan:  Check B 12 lab today.   Statin declined:   She is seeing her PCP and they are discussing options for her higher cholesterol. She wants to try diet and exercise.  Plan: Will begin the plan.    Elevated glucose:   She has a history of elevated glucose,  but denies a history of prediabetes and/or diabetes.  Plan: Will check an insulin level.   Previous labs reviewed today. Date: 10/18/22 CMP, HgbA1c, and CBC  Labs done today CMP, Lipids, Insulin, Vit D, and Vit B12   Morbid Obesity: BMI (Calculated): 43.58   Raileigh is currently in the action stage of change and her goal is to begin weight loss efforts. I recommend Marta begin the structured treatment plan as follows:  She has agreed to Category 3 Plan  Exercise goals: All adults should avoid inactivity. Some activity is better than none, and adults who participate in any amount of physical activity, gain some health benefits.  Behavioral modification strategies:increasing lean protein intake, increasing vegetables, increase high fiber foods, decreasing eating out, no skipping meals, better snacking choices, and avoiding temptations  She was informed of the importance of frequent follow-up visits to maximize her success with intensive lifestyle modifications for her multiple health conditions. She was informed we would discuss her lab results at her next visit unless there is a critical  issue that needs to be addressed sooner. Grayson agreed to keep her next visit at the agreed upon time to discuss these results.  Objective:  General: Cooperative, alert, well developed, in no acute distress. HEENT: Conjunctivae and lids unremarkable. Cardiovascular: Regular rhythm.  Lungs: Normal work of breathing. Neurologic: No focal deficits.   Lab Results  Component Value Date   CREATININE 0.71 10/18/2022   BUN 21 10/18/2022   NA 142 10/18/2022   K 4.5 10/18/2022   CL 106 10/18/2022   CO2 27 10/18/2022   Lab Results  Component Value Date   ALT 20 10/18/2022   AST 13 10/18/2022   ALKPHOS 96 10/18/2022   BILITOT 0.4 10/18/2022   Lab Results  Component Value Date   HGBA1C 5.6 10/18/2022   HGBA1C 5.2 04/05/2017   No results found for: "INSULIN" Lab Results  Component Value Date   TSH 0.25 (L) 10/18/2022   Lab Results  Component Value Date   CHOL 267 (H) 03/04/2021   HDL 41.90 03/04/2021   LDLCALC 175 04/05/2017   LDLDIRECT 211.0 10/18/2022   TRIG 205.0 (H) 03/04/2021   CHOLHDL 6 03/04/2021   Lab Results  Component Value Date   WBC 7.2 10/18/2022   HGB 13.9 10/18/2022   HCT 42.1 10/18/2022   MCV 88.0 10/18/2022   PLT 320.0 10/18/2022   No results found for: "IRON", "TIBC", "FERRITIN"  Attestation Statements:  Applicable history such as the following:  allergies, medications, problem list, medical history, surgical history, family history, social history, and previous encounter notes reviewed by clinician on day of visit:   Time spent on visit including the items listed below was 48 minutes.  -preparing to see the patient (e.g., review of tests, history, previous notes) -obtaining and/or reviewing separately obtained history -counseling and educating the patient/family/caregiver -documenting clinical information in the electronic or other health record -ordering medications, tests, or procedures -independently interpreting results and communicating  results to the patient/ family/caregiver -referring and communicating with other health care professionals  -care coordination   This may have been prepared with the assistance of Engineer, civil (consulting).  Occasional wrong-word or sound-a-like substitutions may have occurred due to the inherent limitations of voice recognition software.    Corinna Capra, DO

## 2023-05-15 ENCOUNTER — Encounter: Payer: Self-pay | Admitting: Bariatrics

## 2023-05-15 DIAGNOSIS — E559 Vitamin D deficiency, unspecified: Secondary | ICD-10-CM | POA: Insufficient documentation

## 2023-05-15 DIAGNOSIS — E039 Hypothyroidism, unspecified: Secondary | ICD-10-CM | POA: Insufficient documentation

## 2023-05-15 DIAGNOSIS — E88819 Insulin resistance, unspecified: Secondary | ICD-10-CM | POA: Insufficient documentation

## 2023-05-15 LAB — COMPREHENSIVE METABOLIC PANEL
ALT: 19 IU/L (ref 0–32)
AST: 16 IU/L (ref 0–40)
Albumin: 4.6 g/dL (ref 3.9–4.9)
Alkaline Phosphatase: 121 IU/L (ref 44–121)
BUN/Creatinine Ratio: 14 (ref 12–28)
BUN: 11 mg/dL (ref 8–27)
Bilirubin Total: 0.3 mg/dL (ref 0.0–1.2)
CO2: 22 mmol/L (ref 20–29)
Calcium: 9.9 mg/dL (ref 8.7–10.3)
Chloride: 104 mmol/L (ref 96–106)
Creatinine, Ser: 0.78 mg/dL (ref 0.57–1.00)
Globulin, Total: 2.1 g/dL (ref 1.5–4.5)
Glucose: 88 mg/dL (ref 70–99)
Potassium: 4.3 mmol/L (ref 3.5–5.2)
Sodium: 143 mmol/L (ref 134–144)
Total Protein: 6.7 g/dL (ref 6.0–8.5)
eGFR: 85 mL/min/{1.73_m2} (ref >59)

## 2023-05-15 LAB — LIPID PANEL WITH LDL/HDL RATIO
Cholesterol, Total: 286 mg/dL — ABNORMAL HIGH (ref 100–199)
HDL: 41 mg/dL (ref >39)
LDL Chol Calc (NIH): 179 mg/dL — ABNORMAL HIGH (ref 0–99)
LDL/HDL Ratio: 4.4 ratio — ABNORMAL HIGH (ref 0.0–3.2)
Triglycerides: 337 mg/dL — ABNORMAL HIGH (ref 0–149)
VLDL Cholesterol Cal: 66 mg/dL — ABNORMAL HIGH (ref 5–40)

## 2023-05-15 LAB — VITAMIN B12: Vitamin B-12: 699 pg/mL (ref 232–1245)

## 2023-05-15 LAB — INSULIN, RANDOM: INSULIN: 46 u[IU]/mL — ABNORMAL HIGH (ref 2.6–24.9)

## 2023-05-15 LAB — VITAMIN D 25 HYDROXY (VIT D DEFICIENCY, FRACTURES): Vit D, 25-Hydroxy: 17 ng/mL — ABNORMAL LOW (ref 30.0–100.0)

## 2023-05-28 ENCOUNTER — Encounter: Payer: Self-pay | Admitting: Bariatrics

## 2023-05-28 ENCOUNTER — Ambulatory Visit: Admitting: Bariatrics

## 2023-05-28 VITALS — BP 164/90 | HR 92 | Temp 97.7°F | Ht 64.0 in | Wt 251.0 lb

## 2023-05-28 DIAGNOSIS — E559 Vitamin D deficiency, unspecified: Secondary | ICD-10-CM

## 2023-05-28 DIAGNOSIS — E88819 Insulin resistance, unspecified: Secondary | ICD-10-CM

## 2023-05-28 DIAGNOSIS — Z6841 Body Mass Index (BMI) 40.0 and over, adult: Secondary | ICD-10-CM

## 2023-05-28 DIAGNOSIS — E7800 Pure hypercholesterolemia, unspecified: Secondary | ICD-10-CM

## 2023-05-28 DIAGNOSIS — E66813 Obesity, class 3: Secondary | ICD-10-CM

## 2023-05-28 MED ORDER — VITAMIN D (ERGOCALCIFEROL) 1.25 MG (50000 UNIT) PO CAPS: 50000.0000 [IU] | ORAL_CAPSULE | ORAL | 0 refills | Status: DC

## 2023-05-28 NOTE — Progress Notes (Unsigned)
 First follow-up after initial visit.        WEIGHT SUMMARY AND BIOMETRICS  Weight Lost Since Last Visit: 3lb  Weight Gained Since Last Visit: 0   Vitals Temp: 97.7 F (36.5 C) BP: (!) 164/90 Pulse Rate: 92 SpO2: (!) 89 %   Anthropometric Measurements Height: 5\' 4"  (1.626 m) Weight: 251 lb (113.9 kg) BMI (Calculated): 43.06 Weight at Last Visit: 254lb Weight Lost Since Last Visit: 3lb Weight Gained Since Last Visit: 0 Starting Weight: 254lb Total Weight Loss (lbs): 3 lb (1.361 kg) Peak Weight: 267lb   Body Composition  Body Fat %: 49.9 % Fat Mass (lbs): 125.2 lbs Muscle Mass (lbs): 119.4 lbs Total Body Water (lbs): 90.8 lbs Visceral Fat Rating : 17   Other Clinical Data Fasting: no Labs: no Today's Visit #: 2 Starting Date: 05/14/23    OBESITY Nancy Burch is here to discuss her progress with her obesity treatment plan along with follow-up of her obesity related diagnoses.    Nutrition Plan: the Category 3 plan - 50% adherence.  Current exercise: walking  Interim History:  She is down 3 lbs since her last visit.  Eating all of the food on the plan., Protein intake is as prescribed, Is not skipping meals, and Water intake is adequate.  Initial positives regarding the dietary plan: She followed the plan about 50 %.  Initial challenges regarding  the dietary plan: She states that she has not changed that much how she eats.   Hunger is moderately controlled.  Cravings are well controlled.  Assessment/Plan:  Insulin Resistance Nancy Burch has had elevated fasting insulin readings. Goal is HgbA1c < 5.7, fasting insulin at l0 or less, and preferably at 5.  She reports polyphagia.  She may want to try a medication in the future but not at this time.  Medication(s): none Lab Results  Component Value Date   HGBA1C 5.6 10/18/2022   Lab Results   Component Value Date   INSULIN 46.0 (H) 05/14/2023    Plan Medication(s): Discussed medications for obesity very briefly and may consider a medication in the future.  Will work on the agreed upon plan. Will minimize refined carbohydrates ( sweets and starches), and focus more on complex carbohydrates.  Increase the micronutrients found in leafy greens, which include magnesium, polyphenols, and vitamin C which have been postulated to help with insulin sensitivity. Minimize "fast food" and cook more meals at home.  Increase fiber to 25 to 30 grams daily.  Information sheet on " Insulin Resistance and Prediabetes".    Vitamin D Deficiency Vitamin D is not at goal of 50.  Most recent vitamin D level was 17.0. She is not on vitamin D.  Lab Results  Component Value Date   VD25OH 17.0 (L) 05/14/2023   VD25OH 59.1 04/05/2017   VD25OH 65.5 01/04/2017    Plan: Begin prescription vitamin D 50,000 IU weekly.   Elevated Cholesterol:  LDL is not at goal. Medication(s): none  Cardiovascular risk factors: dyslipidemia, obesity (BMI >= 30 kg/m2), and sedentary lifestyle  Lab Results  Component Value Date   CHOL 286 (H) 05/14/2023   HDL 41 05/14/2023   LDLCALC 179 (H) 05/14/2023   LDLDIRECT 211.0 10/18/2022   TRIG 337 (H) 05/14/2023   CHOLHDL 6 03/04/2021   Lab Results  Component Value Date   ALT 19 05/14/2023   AST 16 05/14/2023   ALKPHOS 121 05/14/2023   BILITOT 0.3 05/14/2023   The ASCVD Risk score (Arnett DK, et al., 2019) failed to  calculate for the following reasons:   Risk score cannot be calculated because patient has a medical history suggesting prior/existing ASCVD  Plan:  Will avoid all trans fats.  Will read labels Will minimize saturated fats except the following: low fat meats in moderation, diary, and limited dark chocolate.      Morbid Obesity: Current BMI BMI (Calculated): 43.06    Nancy Burch is currently in the action stage of change. As such, her goal is to  continue with weight loss efforts.  She has agreed to the Category 3 plan.  Exercise goals: All adults should avoid inactivity. Some physical activity is better than none, and adults who participate in any amount of physical activity gain some health benefits.  Behavioral modification strategies: increasing lean protein intake, no meal skipping, meal planning , increase water intake, better snacking choices, planning for success, increasing fiber rich foods, and avoiding temptations.  Nancy Burch has agreed to follow-up with our clinic in 2 weeks.   Labs reviewed today from last visit (CMP, Lipids, insulin, vitamin D, B 12,).   Objective:   VITALS: Per patient if applicable, see vitals. GENERAL: Alert and in no acute distress. CARDIOPULMONARY: No increased WOB. Speaking in clear sentences.  PSYCH: Pleasant and cooperative. Speech normal rate and rhythm. Affect is appropriate. Insight and judgement are appropriate. Attention is focused, linear, and appropriate.  NEURO: Oriented as arrived to appointment on time with no prompting.   Attestation Statements:   This was prepared with the assistance of Engineer, civil (consulting).  Occasional wrong-word or sound-a-like substitutions may have occurred due to the inherent limitations of voice recognition software.   Nancy Capra, DO

## 2023-06-19 ENCOUNTER — Other Ambulatory Visit: Payer: Self-pay | Admitting: Bariatrics

## 2023-06-25 ENCOUNTER — Ambulatory Visit: Admitting: Nurse Practitioner

## 2023-07-03 ENCOUNTER — Telehealth: Admitting: Nurse Practitioner

## 2023-07-03 DIAGNOSIS — E88819 Insulin resistance, unspecified: Secondary | ICD-10-CM | POA: Diagnosis not present

## 2023-07-03 DIAGNOSIS — E559 Vitamin D deficiency, unspecified: Secondary | ICD-10-CM

## 2023-07-03 DIAGNOSIS — E66813 Obesity, class 3: Secondary | ICD-10-CM | POA: Diagnosis not present

## 2023-07-03 DIAGNOSIS — E7800 Pure hypercholesterolemia, unspecified: Secondary | ICD-10-CM | POA: Diagnosis not present

## 2023-07-03 DIAGNOSIS — Z6841 Body Mass Index (BMI) 40.0 and over, adult: Secondary | ICD-10-CM

## 2023-07-03 MED ORDER — VITAMIN D (ERGOCALCIFEROL) 1.25 MG (50000 UNIT) PO CAPS: 50000.0000 [IU] | ORAL_CAPSULE | ORAL | 0 refills | Status: DC

## 2023-07-03 NOTE — Progress Notes (Signed)
 Office: [phone]  /  Fax: [phone]  WEIGHT SUMMARY AND BIOMETRICS  TeleHealth Visit:  This visit was completed with telemedicine (audio/video) technology. [name] has verbally consented to this TeleHealth visit. The patient is located at home, the provider is located at [facility] The participants in this visit include the listed provider and patient. The visit was conducted today via MyChart video.   HPI  Chief Complaint: OBESITY  Nancy Burch is here via video visit to discuss her progress with her obesity treatment plan. She is follow a low carb and high protein meal plan. She states she is walking 3-4 days per week.    Interval History:  Reported weight today 247 lbs.  She notes that she is terrible at tracking. She is trying to make healthier choices and watching "what I am eating".  She is following a low carb, high protein meal plan.  She is eating 3 meals per day.  BF:  uncrustables or protein shake, lunch lunchable and dinner protein and/or vegetables.  Reports polyphagia and rarely cravings. Her husband likes to have sweets after dinner and tends to bring sweets home. She is drinking water daily and a zero soda occasionally.    Pharmacotherapy for weight loss: She is not currently taking medications  for medical weight loss.      Previous pharmacotherapy for medical weight loss:  Wegovy  (side effects-vomiting), Saxenda, Vit B12 injections   Bariatric surgery:  Patient has not had bariatric surgery   Insulin  Resistance Last fasting insulin  was 46.0. A1c was 5.6. Polyphagia:Yes Medication(s): None  Lab Results  Component Value Date   HGBA1C 5.6 10/18/2022   HGBA1C 5.2 04/05/2017   Lab Results  Component Value Date   INSULIN  46.0 (H) 05/14/2023     Hyperlipidemia Medication(s): None. Denies side effects.  Took sometime in the past and stopped due to elevated liver enzymes.  Cardiovascular risk factors: dyslipidemia, obesity (BMI >= 30 kg/m2), and sedentary  lifestyle  Lab Results  Component Value Date   CHOL 286 (H) 05/14/2023   HDL 41 05/14/2023   LDLCALC 179 (H) 05/14/2023   LDLDIRECT 211.0 10/18/2022   TRIG 337 (H) 05/14/2023   CHOLHDL 6 03/04/2021   Lab Results  Component Value Date   ALT 19 05/14/2023   AST 16 05/14/2023   ALKPHOS 121 05/14/2023   BILITOT 0.3 05/14/2023   The ASCVD Risk score (Arnett DK, et al., 2019) failed to calculate for the following reasons:   Risk score cannot be calculated because patient has a medical history suggesting prior/existing ASCVD   Vit D deficiency  She is taking Vit D 50,000 IU weekly.  Denies side effects.  Denies nausea, vomiting or muscle weakness.    Lab Results  Component Value Date   VD25OH 17.0 (L) 05/14/2023   VD25OH 59.1 04/05/2017   VD25OH 65.5 01/04/2017    PHYSICAL EXAM:  Last menstrual period 08/16/2011. There is no height or weight on file to calculate BMI.  General: She is overweight, cooperative, alert, well developed, and in no acute distress. PSYCH: Has normal mood, affect and thought process.   Extremities: No edema.  Neurologic: No gross sensory or motor deficits. No tremors or fasciculations noted.    DIAGNOSTIC DATA REVIEWED:  BMET    Component Value Date/Time   NA 143 05/14/2023 0837   K 4.3 05/14/2023 0837   CL 104 05/14/2023 0837   CO2 22 05/14/2023 0837   GLUCOSE 88 05/14/2023 0837   GLUCOSE 121 (H) 10/18/2022 1123  BUN 11 05/14/2023 0837   CREATININE 0.78 05/14/2023 0837   CALCIUM  9.9 05/14/2023 0837   GFRNONAA >60 06/30/2015 0344   GFRAA >60 06/30/2015 0344   Lab Results  Component Value Date   HGBA1C 5.6 10/18/2022   HGBA1C 5.2 04/05/2017   Lab Results  Component Value Date   INSULIN  46.0 (H) 05/14/2023   Lab Results  Component Value Date   TSH 0.25 (L) 10/18/2022   CBC    Component Value Date/Time   WBC 7.2 10/18/2022 1123   RBC 4.79 10/18/2022 1123   HGB 13.9 10/18/2022 1123   HCT 42.1 10/18/2022 1123   PLT 320.0  10/18/2022 1123   MCV 88.0 10/18/2022 1123   MCH 29.6 06/30/2015 0344   MCHC 32.9 10/18/2022 1123   RDW 13.7 10/18/2022 1123   Iron Studies No results found for: "IRON", "TIBC", "FERRITIN", "IRONPCTSAT" Lipid Panel     Component Value Date/Time   CHOL 286 (H) 05/14/2023 0837   TRIG 337 (H) 05/14/2023 0837   HDL 41 05/14/2023 0837   CHOLHDL 6 03/04/2021 0942   VLDL 41.0 (H) 03/04/2021 0942   LDLCALC 179 (H) 05/14/2023 0837   LDLDIRECT 211.0 10/18/2022 1123   Hepatic Function Panel     Component Value Date/Time   PROT 6.7 05/14/2023 0837   ALBUMIN 4.6 05/14/2023 0837   AST 16 05/14/2023 0837   ALT 19 05/14/2023 0837   ALKPHOS 121 05/14/2023 0837   BILITOT 0.3 05/14/2023 0837   BILIDIR 0.11 04/05/2017 0000      Component Value Date/Time   TSH 0.25 (L) 10/18/2022 1123   Nutritional Lab Results  Component Value Date   VD25OH 17.0 (L) 05/14/2023   VD25OH 59.1 04/05/2017   VD25OH 65.5 01/04/2017     ASSESSMENT AND PLAN  TREATMENT PLAN FOR OBESITY:  Recommended Dietary Goals  Trevon is currently in the action stage of change. As such, her goal is to continue weight management plan. She has agreed to keeping a food journal and adhering to recommended goals of 1500-1600 calories and 100 grams of protein.  Discussed the need and importance of meeting calories and protein goals.    Behavioral Intervention  We discussed the following Behavioral Modification Strategies today: increasing lean protein intake to established goals, decreasing simple carbohydrates , increasing vegetables, increasing fiber rich foods, increasing water intake , and continue to work on maintaining a reduced calorie state, getting the recommended amount of protein, incorporating whole foods, making healthy choices, staying well hydrated and practicing mindfulness when eating..  Additional resources provided today: NA  Recommended Physical Activity Goals  Ieesha has been advised to work up to 150  minutes of moderate intensity aerobic activity a week and strengthening exercises 2-3 times per week for cardiovascular health, weight loss maintenance and preservation of muscle mass.   She has agreed to Think about enjoyable ways to increase daily physical activity and overcoming barriers to exercise, Increase physical activity in their day and reduce sedentary time (increase NEAT)., Start strengthening exercises with a goal of 2-3 sessions a week , and continue to gradually increase the amount and intensity of exercise routine    ASSOCIATED CONDITIONS ADDRESSED TODAY  Action/Plan  Insulin  resistance Discussed Metformin.  Will consider.  Will rediscuss at next visit  Chieko will continue to work on weight loss, exercise, and decreasing simple carbohydrates to help decrease the risk of diabetes. Minori agreed to follow-up with us  as directed to closely monitor her progress.   Vitamin D  deficiency -  Vitamin D  (Ergocalciferol ); Take 1 capsule (50,000 Units total) by mouth every 7 (seven) days.  Dispense: 12 capsule; Refill: 0.  Side effects discussed  Low Vitamin D  level contributes to fatigue and are associated with obesity, breast, and colon cancer. She agrees to continue to take prescription Vitamin D  @50 ,000 IU every week and will follow-up for routine testing of Vitamin D , at least 2-3 times per year to avoid over-replacement.   Elevated cholesterol Cardiovascular risk and specific lipid/LDL goals reviewed.  We discussed several lifestyle modifications today and Vista will continue to work on diet, exercise and weight loss efforts. Orders and follow up as documented in patient record.   Counseling Intensive lifestyle modifications are the first line treatment for this issue. Dietary changes: Increase soluble fiber. Decrease simple carbohydrates. Exercise changes: Moderate to vigorous-intensity aerobic activity 150 minutes per week if tolerated. Lipid-lowering medications: see  documented in medical record.  Patient is not interested in starting treatment.  Took a statin in the past and stopped due to side effects.   Class 3 severe obesity due to excess calories with serious comorbidity and body mass index (BMI) of 40.0 to 44.9 in adult         Return in about 4 weeks (around 07/31/2023).Aaron Aas She was informed of the importance of frequent follow up visits to maximize her success with intensive lifestyle modifications for her multiple health conditions.   ATTESTASTION STATEMENTS:  Reviewed by clinician on day of visit: allergies, medications, problem list, medical history, surgical history, family history, social history, and previous encounter notes.     Crist Dominion. Winson Eichorn FNP-C

## 2023-07-30 ENCOUNTER — Encounter: Payer: Self-pay | Admitting: Nurse Practitioner

## 2023-07-30 ENCOUNTER — Ambulatory Visit: Admitting: Nurse Practitioner

## 2023-07-30 VITALS — BP 122/68 | HR 60 | Temp 97.8°F | Ht 64.0 in | Wt 249.0 lb

## 2023-07-30 DIAGNOSIS — Z6841 Body Mass Index (BMI) 40.0 and over, adult: Secondary | ICD-10-CM | POA: Diagnosis not present

## 2023-07-30 DIAGNOSIS — E66813 Obesity, class 3: Secondary | ICD-10-CM | POA: Diagnosis not present

## 2023-07-30 DIAGNOSIS — E88819 Insulin resistance, unspecified: Secondary | ICD-10-CM | POA: Diagnosis not present

## 2023-07-30 DIAGNOSIS — E063 Autoimmune thyroiditis: Secondary | ICD-10-CM | POA: Diagnosis not present

## 2023-07-30 MED ORDER — METFORMIN HCL 500 MG PO TABS
500.0000 mg | ORAL_TABLET | Freq: Every day | ORAL | 0 refills | Status: DC
Start: 2023-07-30 — End: 2023-08-27

## 2023-07-30 NOTE — Progress Notes (Signed)
 Office: [phone]  /  Fax: [phone]  WEIGHT SUMMARY AND BIOMETRICS  Weight Lost Since Last Visit: 2lb  Weight Gained Since Last Visit: 0   Vitals Temp: 97.8 F (36.6 C) BP: 122/68 Pulse Rate: 60 SpO2: 99 %   Anthropometric Measurements Height: 5\' 4"  (1.626 m) Weight: 249 lb (112.9 kg) BMI (Calculated): 42.72 Weight at Last Visit: 251lb Weight Lost Since Last Visit: 2lb Weight Gained Since Last Visit: 0 Starting Weight: 254lb Total Weight Loss (lbs): 5 lb (2.268 kg)   Body Composition  Body Fat %: 49.3 % Fat Mass (lbs): 123 lbs Muscle Mass (lbs): 120 lbs Total Body Water (lbs): 85.2 lbs Visceral Fat Rating : 17   Other Clinical Data Fasting: yes Labs: no Today's Visit #: 4 Starting Date: 05/14/23     HPI  Chief Complaint: OBESITY  Nancy Burch is here to discuss her progress with her obesity treatment plan. She is on the keeping a food journal and adhering to recommended goals of 1,500-1,600 calories and 100 protein and states she is following her eating plan approximately 50 % of the time. She states she is exercising 30 minutes 4 days per week.   Interval History:  Since last office visit she has lost 2 pounds.  She has been making healthier choices, watching her portion sizes, drinking more water and has been walking more.  Notes polyphagia, not struggling with cravings.    Pharmacotherapy for weight loss: She is not currently taking medications  for medical weight loss.       Previous pharmacotherapy for medical weight loss:  Wegovy  (side effects-vomiting), Saxenda, Vit B12 injections    Bariatric surgery:  Patient has not had bariatric surgery    Insulin  Resistance Last fasting insulin  was 46.0. A1c was 5.6. Polyphagia:Yes Medication(s): None  Lab Results  Component Value Date   HGBA1C 5.6 10/18/2022   HGBA1C 5.2 04/05/2017   Lab Results  Component Value Date   INSULIN  46.0 (H) 05/14/2023   Hypothyroidism Stable.  Does not report  symptoms associated with uncontrolled hypothyroidism.  Saw endo last in March. Reports TSH was wnl.   Medication(s): Levothyroxine  137 mcg daily.  Denies side effects   Lab Results  Component Value Date   TSH 0.25 (L) 10/18/2022    PHYSICAL EXAM:  Blood pressure 122/68, pulse 60, temperature 97.8 F (36.6 C), height 5\' 4"  (1.626 m), weight 249 lb (112.9 kg), last menstrual period 08/16/2011, SpO2 99%. Body mass index is 42.74 kg/m.  General: She is overweight, cooperative, alert, well developed, and in no acute distress. PSYCH: Has normal mood, affect and thought process.   Extremities: No edema.  Neurologic: No gross sensory or motor deficits. No tremors or fasciculations noted.    DIAGNOSTIC DATA REVIEWED:  BMET    Component Value Date/Time   NA 143 05/14/2023 0837   K 4.3 05/14/2023 0837   CL 104 05/14/2023 0837   CO2 22 05/14/2023 0837   GLUCOSE 88 05/14/2023 0837   GLUCOSE 121 (H) 10/18/2022 1123   BUN 11 05/14/2023 0837   CREATININE 0.78 05/14/2023 0837   CALCIUM  9.9 05/14/2023 0837   GFRNONAA >60 06/30/2015 0344   GFRAA >60 06/30/2015 0344   Lab Results  Component Value Date   HGBA1C 5.6 10/18/2022   HGBA1C 5.2 04/05/2017   Lab Results  Component Value Date   INSULIN  46.0 (H) 05/14/2023   Lab Results  Component Value Date   TSH 0.25 (L) 10/18/2022   CBC    Component Value  Date/Time   WBC 7.2 10/18/2022 1123   RBC 4.79 10/18/2022 1123   HGB 13.9 10/18/2022 1123   HCT 42.1 10/18/2022 1123   PLT 320.0 10/18/2022 1123   MCV 88.0 10/18/2022 1123   MCH 29.6 06/30/2015 0344   MCHC 32.9 10/18/2022 1123   RDW 13.7 10/18/2022 1123   Iron Studies No results found for: "IRON", "TIBC", "FERRITIN", "IRONPCTSAT" Lipid Panel     Component Value Date/Time   CHOL 286 (H) 05/14/2023 0837   TRIG 337 (H) 05/14/2023 0837   HDL 41 05/14/2023 0837   CHOLHDL 6 03/04/2021 0942   VLDL 41.0 (H) 03/04/2021 0942   LDLCALC 179 (H) 05/14/2023 0837   LDLDIRECT 211.0  10/18/2022 1123   Hepatic Function Panel     Component Value Date/Time   PROT 6.7 05/14/2023 0837   ALBUMIN 4.6 05/14/2023 0837   AST 16 05/14/2023 0837   ALT 19 05/14/2023 0837   ALKPHOS 121 05/14/2023 0837   BILITOT 0.3 05/14/2023 0837   BILIDIR 0.11 04/05/2017 0000      Component Value Date/Time   TSH 0.25 (L) 10/18/2022 1123   Nutritional Lab Results  Component Value Date   VD25OH 17.0 (L) 05/14/2023   VD25OH 59.1 04/05/2017   VD25OH 65.5 01/04/2017     ASSESSMENT AND PLAN  TREATMENT PLAN FOR OBESITY:  Recommended Dietary Goals  Brenley is currently in the action stage of change. As such, her goal is to continue weight management plan. She has agreed to keeping a food journal and adhering to recommended goals of 1600 calories and 100+ grams protein.  Behavioral Intervention  We discussed the following Behavioral Modification Strategies today: increasing lean protein intake to established goals, decreasing simple carbohydrates , increasing vegetables, increasing fiber rich foods, increasing water intake , work on meal planning and preparation, work on tracking and journaling calories using tracking application, and continue to work on maintaining a reduced calorie state, getting the recommended amount of protein, incorporating whole foods, making healthy choices, staying well hydrated and practicing mindfulness when eating..  Additional resources provided today: IR, prediabetes, Metformin  Recommended Physical Activity Goals  Freddie has been advised to work up to 150 minutes of moderate intensity aerobic activity a week and strengthening exercises 2-3 times per week for cardiovascular health, weight loss maintenance and preservation of muscle mass.   She has agreed to Think about enjoyable ways to increase daily physical activity and overcoming barriers to exercise, Increase physical activity in their day and reduce sedentary time (increase NEAT)., and continue to  gradually increase the amount and intensity of exercise routine   ASSOCIATED CONDITIONS ADDRESSED TODAY  Action/Plan  Insulin  resistance -     metFORMIN HCl; Take 1 tablet (500 mg total) by mouth daily with breakfast.  Dispense: 30 tablet; Refill: 0.  Side effects discussed. Handout give on Metformin.    Hypothyroidism due to Hashimoto's thyroiditis Continue  to follow up with endo. Continue meds as directed  Class 3 severe obesity due to excess calories with body mass index (BMI) of 40.0 to 44.9 in adult, unspecified whether serious comorbidity present      To discuss DEXA with PCP-history of Vit D def and wrist fx after a fall.    Return in about 4 weeks (around 08/27/2023).Aaron Aas She was informed of the importance of frequent follow up visits to maximize her success with intensive lifestyle modifications for her multiple health conditions.   ATTESTASTION STATEMENTS:  Reviewed by clinician on day of visit: allergies, medications,  problem list, medical history, surgical history, family history, social history, and previous encounter notes.     Crist Dominion. Mahagony Grieb FNP-C

## 2023-08-21 ENCOUNTER — Other Ambulatory Visit: Payer: Self-pay | Admitting: Nurse Practitioner

## 2023-08-21 DIAGNOSIS — E88819 Insulin resistance, unspecified: Secondary | ICD-10-CM

## 2023-08-27 ENCOUNTER — Encounter: Payer: Self-pay | Admitting: Nurse Practitioner

## 2023-08-27 ENCOUNTER — Ambulatory Visit: Admitting: Nurse Practitioner

## 2023-08-27 VITALS — BP 133/86 | HR 66 | Temp 98.2°F | Ht 64.0 in | Wt 247.0 lb

## 2023-08-27 DIAGNOSIS — E66813 Obesity, class 3: Secondary | ICD-10-CM | POA: Diagnosis not present

## 2023-08-27 DIAGNOSIS — Z6841 Body Mass Index (BMI) 40.0 and over, adult: Secondary | ICD-10-CM | POA: Diagnosis not present

## 2023-08-27 DIAGNOSIS — E88819 Insulin resistance, unspecified: Secondary | ICD-10-CM

## 2023-08-27 MED ORDER — METFORMIN HCL 500 MG PO TABS: 500.0000 mg | ORAL_TABLET | Freq: Two times a day (BID) | ORAL | 0 refills | Status: DC

## 2023-08-27 NOTE — Progress Notes (Signed)
 Office: [phone]  /  Fax: [phone]  WEIGHT SUMMARY AND BIOMETRICS  Weight Lost Since Last Visit: 2lb  Weight Gained Since Last Visit: 0lb   Vitals Temp: 98.2 F (36.8 C) BP: 133/86 Pulse Rate: 66 SpO2: 95 %   Anthropometric Measurements Height: 5' 4 (1.626 m) Weight: 247 lb (112 kg) BMI (Calculated): 42.38 Weight at Last Visit: 249lb Weight Lost Since Last Visit: 2lb Weight Gained Since Last Visit: 0lb Starting Weight: 254lb Total Weight Loss (lbs): 7 lb (3.175 kg)   Body Composition  Body Fat %: 49.3 % Fat Mass (lbs): 122.2 lbs Muscle Mass (lbs): 119.2 lbs Total Body Water (lbs): 84.2 lbs Visceral Fat Rating : 17   Other Clinical Data Fasting: Yes Labs: No Today's Visit #: 5 Starting Date: 05/14/23     HPI  Chief Complaint: OBESITY  Nancy Burch is here to discuss her progress with her obesity treatment plan. She is on the keeping a food journal and adhering to recommended goals of 1500-1600 calories and 100 protein and states she is following her eating plan approximately 90-95 % of the time. She states she is exercising 30-45 minutes 4 days per week.   Interval History:  Since last office visit she has lost 2 pounds.  Notes polyphagia and feels it has gotten worse especially around 4 pm.  She is drinking water and occ diet soda.    BF:  protein shake with uncrustables  Snack:  banana Lunch:  lunchable Snack:  nuts or carrot sticks Dinner:  protein with vegetables.    Pharmacotherapy for weight loss: She is not currently taking medications  for medical weight loss.       Previous pharmacotherapy for medical weight loss:  Wegovy  (side effects-vomiting), Saxenda, Vit B12 injections    Bariatric surgery:  Patient has not had bariatric surgery  Insulin  Resistance Last fasting insulin  was 46. A1c was 5.6. Polyphagia:Yes Medication(s): Metformin  500mg  (started after last visit).  Denies side effects.   Lab Results  Component Value Date    HGBA1C 5.6 10/18/2022   HGBA1C 5.2 04/05/2017   Lab Results  Component Value Date   INSULIN  46.0 (H) 05/14/2023    PHYSICAL EXAM:  Blood pressure 133/86, pulse 66, temperature 98.2 F (36.8 C), height 5' 4 (1.626 m), weight 247 lb (112 kg), last menstrual period 08/16/2011, SpO2 95%. Body mass index is 42.4 kg/m.  General: She is overweight, cooperative, alert, well developed, and in no acute distress. PSYCH: Has normal mood, affect and thought process.   Extremities: No edema.  Neurologic: No gross sensory or motor deficits. No tremors or fasciculations noted.    DIAGNOSTIC DATA REVIEWED:  BMET    Component Value Date/Time   NA 143 05/14/2023 0837   K 4.3 05/14/2023 0837   CL 104 05/14/2023 0837   CO2 22 05/14/2023 0837   GLUCOSE 88 05/14/2023 0837   GLUCOSE 121 (H) 10/18/2022 1123   BUN 11 05/14/2023 0837   CREATININE 0.78 05/14/2023 0837   CALCIUM  9.9 05/14/2023 0837   GFRNONAA >60 06/30/2015 0344   GFRAA >60 06/30/2015 0344   Lab Results  Component Value Date   HGBA1C 5.6 10/18/2022   HGBA1C 5.2 04/05/2017   Lab Results  Component Value Date   INSULIN  46.0 (H) 05/14/2023   Lab Results  Component Value Date   TSH 0.25 (L) 10/18/2022   CBC    Component Value Date/Time   WBC 7.2 10/18/2022 1123   RBC 4.79 10/18/2022 1123   HGB 13.9  10/18/2022 1123   HCT 42.1 10/18/2022 1123   PLT 320.0 10/18/2022 1123   MCV 88.0 10/18/2022 1123   MCH 29.6 06/30/2015 0344   MCHC 32.9 10/18/2022 1123   RDW 13.7 10/18/2022 1123   Iron Studies No results found for: IRON, TIBC, FERRITIN, IRONPCTSAT Lipid Panel     Component Value Date/Time   CHOL 286 (H) 05/14/2023 0837   TRIG 337 (H) 05/14/2023 0837   HDL 41 05/14/2023 0837   CHOLHDL 6 03/04/2021 0942   VLDL 41.0 (H) 03/04/2021 0942   LDLCALC 179 (H) 05/14/2023 0837   LDLDIRECT 211.0 10/18/2022 1123   Hepatic Function Panel     Component Value Date/Time   PROT 6.7 05/14/2023 0837   ALBUMIN 4.6  05/14/2023 0837   AST 16 05/14/2023 0837   ALT 19 05/14/2023 0837   ALKPHOS 121 05/14/2023 0837   BILITOT 0.3 05/14/2023 0837   BILIDIR 0.11 04/05/2017 0000      Component Value Date/Time   TSH 0.25 (L) 10/18/2022 1123   Nutritional Lab Results  Component Value Date   VD25OH 17.0 (L) 05/14/2023   VD25OH 59.1 04/05/2017   VD25OH 65.5 01/04/2017     ASSESSMENT AND PLAN  TREATMENT PLAN FOR OBESITY:  Recommended Dietary Goals  Nancy Burch is currently in the action stage of change. As such, her goal is to continue weight management plan. She has agreed to keeping a food journal and adhering to recommended goals of 1600 calories and 100+ grams protein.    Behavioral Intervention  We discussed the following Behavioral Modification Strategies today: increasing lean protein intake to established goals, decreasing simple carbohydrates , increasing vegetables, increasing fiber rich foods, increasing water intake , work on tracking and journaling calories using tracking application, and continue to work on maintaining a reduced calorie state, getting the recommended amount of protein, incorporating whole foods, making healthy choices, staying well hydrated and practicing mindfulness when eating..  Additional resources provided today: NA  Recommended Physical Activity Goals  Nancy Burch has been advised to work up to 150 minutes of moderate intensity aerobic activity a week and strengthening exercises 2-3 times per week for cardiovascular health, weight loss maintenance and preservation of muscle mass.   She has agreed to Think about enjoyable ways to increase daily physical activity and overcoming barriers to exercise, Increase physical activity in their day and reduce sedentary time (increase NEAT)., and continue to gradually increase the amount and intensity of exercise routine  ASSOCIATED CONDITIONS ADDRESSED TODAY  Action/Plan  Insulin  resistance -     Increase metFORMIN  HCl; Take 1 tablet  (500 mg total) by mouth 2 (two) times daily with a meal.  Dispense: 60 tablet; Refill: 0. Side effects discussed.   Class 3 severe obesity due to excess calories with body mass index (BMI) of 40.0 to 44.9 in adult, unspecified whether serious comorbidity present      Options discussed today: Increase Metformin  dose-change to BID Change Metformin  to lunch Add Lomaira at lunch (would recheck TSH prior to starting).   Return in about 4 weeks (around 09/24/2023).Nancy Burch She was informed of the importance of frequent follow up visits to maximize her success with intensive lifestyle modifications for her multiple health conditions.   ATTESTASTION STATEMENTS:  Reviewed by clinician on day of visit: allergies, medications, problem list, medical history, surgical history, family history, social history, and previous encounter notes.     Nancy Burch SAUNDERS. Moishe Schellenberg FNP-C

## 2023-09-19 ENCOUNTER — Other Ambulatory Visit: Payer: Self-pay | Admitting: Nurse Practitioner

## 2023-09-19 DIAGNOSIS — E88819 Insulin resistance, unspecified: Secondary | ICD-10-CM

## 2023-09-23 ENCOUNTER — Other Ambulatory Visit: Payer: Self-pay | Admitting: Nurse Practitioner

## 2023-09-23 DIAGNOSIS — E559 Vitamin D deficiency, unspecified: Secondary | ICD-10-CM

## 2023-09-25 ENCOUNTER — Ambulatory Visit: Admitting: Nurse Practitioner

## 2023-09-25 ENCOUNTER — Telehealth: Payer: Self-pay

## 2023-09-25 ENCOUNTER — Encounter: Payer: Self-pay | Admitting: Nurse Practitioner

## 2023-09-25 ENCOUNTER — Other Ambulatory Visit: Payer: Self-pay | Admitting: Nurse Practitioner

## 2023-09-25 VITALS — BP 123/81 | HR 70 | Temp 98.1°F | Ht 64.0 in | Wt 250.0 lb

## 2023-09-25 DIAGNOSIS — E66813 Obesity, class 3: Secondary | ICD-10-CM

## 2023-09-25 DIAGNOSIS — E88819 Insulin resistance, unspecified: Secondary | ICD-10-CM

## 2023-09-25 DIAGNOSIS — E559 Vitamin D deficiency, unspecified: Secondary | ICD-10-CM | POA: Diagnosis not present

## 2023-09-25 DIAGNOSIS — Z6841 Body Mass Index (BMI) 40.0 and over, adult: Secondary | ICD-10-CM

## 2023-09-25 DIAGNOSIS — R110 Nausea: Secondary | ICD-10-CM

## 2023-09-25 MED ORDER — METFORMIN HCL 500 MG PO TABS
500.0000 mg | ORAL_TABLET | Freq: Two times a day (BID) | ORAL | 0 refills | Status: DC
Start: 2023-09-25 — End: 2023-09-25

## 2023-09-25 MED ORDER — VITAMIN D (ERGOCALCIFEROL) 1.25 MG (50000 UNIT) PO CAPS: 50000.0000 [IU] | ORAL_CAPSULE | ORAL | 0 refills | Status: DC

## 2023-09-25 MED ORDER — ZEPBOUND 2.5 MG/0.5ML ~~LOC~~ SOAJ: 2.5000 mg | SUBCUTANEOUS | 0 refills | Status: DC

## 2023-09-25 MED ORDER — ONDANSETRON HCL 4 MG PO TABS: 4.0000 mg | ORAL_TABLET | Freq: Three times a day (TID) | ORAL | 0 refills | Status: DC | PRN

## 2023-09-25 NOTE — Patient Instructions (Signed)

## 2023-09-25 NOTE — Progress Notes (Signed)
 Office: [phone]  /  Fax: [phone]  WEIGHT SUMMARY AND BIOMETRICS  Weight Lost Since Last Visit: 0lb  Weight Gained Since Last Visit: 3lb   Vitals Temp: 98.1 F (36.7 C) BP: 123/81 Pulse Rate: 70 SpO2: 96 %   Anthropometric Measurements Height: 5' 4 (1.626 m) Weight: 250 lb (113.4 kg) BMI (Calculated): 42.89 Weight at Last Visit: 247lb Weight Lost Since Last Visit: 0lb Weight Gained Since Last Visit: 3lb Starting Weight: 254lb Total Weight Loss (lbs): 4 lb (1.814 kg)   Body Composition  Body Fat %: 49.6 % Fat Mass (lbs): 124.2 lbs Muscle Mass (lbs): 120 lbs Total Body Water (lbs): 86 lbs Visceral Fat Rating : 17   Other Clinical Data Fasting: Yes Labs: No Today's Visit #: 6 Starting Date: 05/14/23     HPI  Chief Complaint: OBESITY  Bryony is here to discuss her progress with her obesity treatment plan. She is on the keeping a food journal and adhering to recommended goals of 1500-1600 calories and 100 protein and states she is following her eating plan approximately 80 % of the time. She states she is exercising 30 minutes 4 days per week.   Interval History:  Since last office visit she has gained 3 pounds.  She is not skipping meals and is trying to eat a protein with each meal. She is drinking water (not enough), a protein shake and diet ginger ale.  She is walking 4 days per week.  Notes polyphagia and cravings if certain foods are brought into the house.    Pharmacotherapy for weight loss: She is not currently taking medications  for medical weight loss.       Previous pharmacotherapy for medical weight loss:  Wegovy  (side effects-vomiting), Saxenda (side effects -vomiting), Vit B12 injections    Bariatric surgery:  Patient has not had bariatric surgery  Insulin  Resistance Last fasting insulin  was 46. A1c was 5.6. Polyphagia:Yes Medication(s): Metformin  500mg  BID (forgets to take on a regular basis).  Denies side effects.   Lab  Results  Component Value Date   HGBA1C 5.6 10/18/2022   HGBA1C 5.2 04/05/2017   Lab Results  Component Value Date   INSULIN  46.0 (H) 05/14/2023   Vit D deficiency  She is taking Vit D 50,000 IU weekly.  Denies side effects.  Denies nausea, vomiting or muscle weakness.    Lab Results  Component Value Date   VD25OH 17.0 (L) 05/14/2023   VD25OH 59.1 04/05/2017   VD25OH 65.5 01/04/2017     PHYSICAL EXAM:  Blood pressure 123/81, pulse 70, temperature 98.1 F (36.7 C), height 5' 4 (1.626 m), weight 250 lb (113.4 kg), last menstrual period 08/16/2011, SpO2 96%. Body mass index is 42.91 kg/m.  General: She is overweight, cooperative, alert, well developed, and in no acute distress. PSYCH: Has normal mood, affect and thought process.   Extremities: No edema.  Neurologic: No gross sensory or motor deficits. No tremors or fasciculations noted.    DIAGNOSTIC DATA REVIEWED:  BMET    Component Value Date/Time   NA 143 05/14/2023 0837   K 4.3 05/14/2023 0837   CL 104 05/14/2023 0837   CO2 22 05/14/2023 0837   GLUCOSE 88 05/14/2023 0837   GLUCOSE 121 (H) 10/18/2022 1123   BUN 11 05/14/2023 0837   CREATININE 0.78 05/14/2023 0837   CALCIUM  9.9 05/14/2023 0837   GFRNONAA >60 06/30/2015 0344   GFRAA >60 06/30/2015 0344   Lab Results  Component Value Date   HGBA1C 5.6  10/18/2022   HGBA1C 5.2 04/05/2017   Lab Results  Component Value Date   INSULIN  46.0 (H) 05/14/2023   Lab Results  Component Value Date   TSH 0.25 (L) 10/18/2022   CBC    Component Value Date/Time   WBC 7.2 10/18/2022 1123   RBC 4.79 10/18/2022 1123   HGB 13.9 10/18/2022 1123   HCT 42.1 10/18/2022 1123   PLT 320.0 10/18/2022 1123   MCV 88.0 10/18/2022 1123   MCH 29.6 06/30/2015 0344   MCHC 32.9 10/18/2022 1123   RDW 13.7 10/18/2022 1123   Iron Studies No results found for: IRON, TIBC, FERRITIN, IRONPCTSAT Lipid Panel     Component Value Date/Time   CHOL 286 (H) 05/14/2023 0837   TRIG  337 (H) 05/14/2023 0837   HDL 41 05/14/2023 0837   CHOLHDL 6 03/04/2021 0942   VLDL 41.0 (H) 03/04/2021 0942   LDLCALC 179 (H) 05/14/2023 0837   LDLDIRECT 211.0 10/18/2022 1123   Hepatic Function Panel     Component Value Date/Time   PROT 6.7 05/14/2023 0837   ALBUMIN 4.6 05/14/2023 0837   AST 16 05/14/2023 0837   ALT 19 05/14/2023 0837   ALKPHOS 121 05/14/2023 0837   BILITOT 0.3 05/14/2023 0837   BILIDIR 0.11 04/05/2017 0000      Component Value Date/Time   TSH 0.25 (L) 10/18/2022 1123   Nutritional Lab Results  Component Value Date   VD25OH 17.0 (L) 05/14/2023   VD25OH 59.1 04/05/2017   VD25OH 65.5 01/04/2017     ASSESSMENT AND PLAN  TREATMENT PLAN FOR OBESITY:  Recommended Dietary Goals  [place] is currently in the action stage of change. As such, her goal is to continue weight management plan. She has agreed to keeping a food journal and adhering to recommended goals of 1500-1600 calories and 100+ grams of protein.  Behavioral Intervention  We discussed the following Behavioral Modification Strategies today: increasing lean protein intake to established goals, decreasing simple carbohydrates , increasing vegetables, increasing fiber rich foods, increasing water intake , work on meal planning and preparation, work on tracking and journaling calories using tracking application, and continue to work on maintaining a reduced calorie state, getting the recommended amount of protein, incorporating whole foods, making healthy choices, staying well hydrated and practicing mindfulness when eating..  Additional resources provided today: NA  Recommended Physical Activity Goals  Margerite has been advised to work up to 150 minutes of moderate intensity aerobic activity a week and strengthening exercises 2-3 times per week for cardiovascular health, weight loss maintenance and preservation of muscle mass.   She has agreed to Think about enjoyable ways to increase daily physical  activity and overcoming barriers to exercise, Increase physical activity in their day and reduce sedentary time (increase NEAT)., and continue to gradually increase the amount and intensity of exercise routine   Pharmacotherapy We discussed various medication options to help Leonetta with her weight loss efforts and we both agreed to start Zepbound  2.5mg . Side effects discussed. Has taken Zofran  in the past with GLP1s for nausea.  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  Dispense: 12 capsule; Refill: 0  Nausea -     Ondansetron  HCl; Take 1 tablet (4 mg total) by mouth every 8 (eight) hours as needed for nausea or vomiting.  Dispense: 20 tablet; Refill: 0  Class 3 severe obesity due to excess calories with body mass index (BMI) of 40.0 to 44.9 in adult, unspecified whether serious comorbidity present -     Zepbound ; Inject 2.5 mg into the skin once a week.  Dispense: 2 mL; Refill: 0     Goals: Increase water intake Take Metformin  on a regular basis     Return in about 4 weeks (around 10/23/2023).SABRA She was informed of the importance of frequent follow up visits to maximize her success with intensive lifestyle modifications for her multiple health conditions.   ATTESTASTION STATEMENTS:  Reviewed by clinician on day of visit: allergies, medications, problem list, medical history, surgical history, family history, social history, and previous encounter notes.     Corean SAUNDERS. Brett Soza FNP-C

## 2023-09-25 NOTE — Telephone Encounter (Signed)
 PA submitted through Cover My Meds for Zepbound . Awaiting insurance determination. Key: [id number]

## 2023-09-26 NOTE — Telephone Encounter (Signed)
 Received fax from [facility] that Zepbound  has been denied. Your plan does not cover this drug

## 2023-10-19 ENCOUNTER — Encounter: Payer: Self-pay | Admitting: Family Medicine

## 2023-10-24 ENCOUNTER — Ambulatory Visit: Admitting: Nurse Practitioner

## 2023-10-30 ENCOUNTER — Other Ambulatory Visit: Payer: Self-pay | Admitting: Nurse Practitioner

## 2023-10-30 DIAGNOSIS — Z6841 Body Mass Index (BMI) 40.0 and over, adult: Secondary | ICD-10-CM

## 2023-11-16 ENCOUNTER — Ambulatory Visit: Admitting: Family Medicine

## 2023-11-16 ENCOUNTER — Encounter: Payer: Self-pay | Admitting: Family Medicine

## 2023-11-16 VITALS — BP 142/90 | HR 85 | Temp 97.6°F | Ht 64.0 in | Wt 258.2 lb

## 2023-11-16 DIAGNOSIS — Z131 Encounter for screening for diabetes mellitus: Secondary | ICD-10-CM | POA: Diagnosis not present

## 2023-11-16 DIAGNOSIS — Z8719 Personal history of other diseases of the digestive system: Secondary | ICD-10-CM | POA: Diagnosis not present

## 2023-11-16 DIAGNOSIS — Z01818 Encounter for other preprocedural examination: Secondary | ICD-10-CM | POA: Diagnosis not present

## 2023-11-16 DIAGNOSIS — E7800 Pure hypercholesterolemia, unspecified: Secondary | ICD-10-CM | POA: Diagnosis not present

## 2023-11-16 DIAGNOSIS — E538 Deficiency of other specified B group vitamins: Secondary | ICD-10-CM

## 2023-11-16 DIAGNOSIS — K8020 Calculus of gallbladder without cholecystitis without obstruction: Secondary | ICD-10-CM

## 2023-11-16 DIAGNOSIS — E559 Vitamin D deficiency, unspecified: Secondary | ICD-10-CM | POA: Diagnosis not present

## 2023-11-16 DIAGNOSIS — E063 Autoimmune thyroiditis: Secondary | ICD-10-CM | POA: Diagnosis not present

## 2023-11-16 DIAGNOSIS — F418 Other specified anxiety disorders: Secondary | ICD-10-CM

## 2023-11-16 DIAGNOSIS — Z5320 Procedure and treatment not carried out because of patient's decision for unspecified reasons: Secondary | ICD-10-CM

## 2023-11-16 DIAGNOSIS — R030 Elevated blood-pressure reading, without diagnosis of hypertension: Secondary | ICD-10-CM

## 2023-11-16 LAB — TSH: TSH: 0.75 u[IU]/mL (ref 0.35–5.50)

## 2023-11-16 LAB — COMPREHENSIVE METABOLIC PANEL WITH GFR
ALT: 25 U/L (ref 0–35)
AST: 18 U/L (ref 0–37)
Albumin: 4.5 g/dL (ref 3.5–5.2)
Alkaline Phosphatase: 87 U/L (ref 39–117)
BUN: 18 mg/dL (ref 6–23)
CO2: 27 meq/L (ref 19–32)
Calcium: 9.6 mg/dL (ref 8.4–10.5)
Chloride: 103 meq/L (ref 96–112)
Creatinine, Ser: 0.74 mg/dL (ref 0.40–1.20)
GFR: 85.88 mL/min (ref >60.00)
Glucose, Bld: 85 mg/dL (ref 70–99)
Potassium: 3.5 meq/L (ref 3.5–5.1)
Sodium: 141 meq/L (ref 135–145)
Total Bilirubin: 0.5 mg/dL (ref 0.2–1.2)
Total Protein: 7.3 g/dL (ref 6.0–8.3)

## 2023-11-16 LAB — CBC
HCT: 41.5 % (ref 36.0–46.0)
Hemoglobin: 14 g/dL (ref 12.0–15.0)
MCHC: 33.6 g/dL (ref 30.0–36.0)
MCV: 86.3 fl (ref 78.0–100.0)
Platelets: 335 K/uL (ref 150.0–400.0)
RBC: 4.8 Mil/uL (ref 3.87–5.11)
RDW: 13.7 % (ref 11.5–15.5)
WBC: 7.4 K/uL (ref 4.0–10.5)

## 2023-11-16 LAB — LIPID PANEL
Cholesterol: 271 mg/dL — ABNORMAL HIGH (ref 0–200)
HDL: 42.8 mg/dL (ref >39.00)
LDL Cholesterol: 185 mg/dL — ABNORMAL HIGH (ref 0–99)
NonHDL: 227.98
Total CHOL/HDL Ratio: 6
Triglycerides: 216 mg/dL — ABNORMAL HIGH (ref 0.0–149.0)
VLDL: 43.2 mg/dL — ABNORMAL HIGH (ref 0.0–40.0)

## 2023-11-16 LAB — HEMOGLOBIN A1C: Hgb A1c MFr Bld: 5.9 % (ref 4.6–6.5)

## 2023-11-16 LAB — VITAMIN D 25 HYDROXY (VIT D DEFICIENCY, FRACTURES): VITD: 43.82 ng/mL (ref 30.00–100.00)

## 2023-11-16 LAB — T4, FREE: Free T4: 1.33 ng/dL (ref 0.60–1.60)

## 2023-11-16 LAB — LDL CHOLESTEROL, DIRECT: Direct LDL: 202 mg/dL

## 2023-11-16 LAB — VITAMIN B12: Vitamin B-12: 518 pg/mL (ref 211–911)

## 2023-11-16 MED ORDER — DULOXETINE HCL 60 MG PO CPEP: 120.0000 mg | ORAL_CAPSULE | Freq: Every day | ORAL | 1 refills | Status: AC

## 2023-11-16 NOTE — Progress Notes (Signed)
 Established Patient Office Visit   Subjective:  Patient ID: Nancy Burch, female    DOB: Mar 31, 1960  Age: 63 y.o. MRN: [id number]  Chief Complaint  Patient presents with   Medical Management of Chronic Issues    Follow Up. Pt is fasting. Pt requested Rx for Duloxetine .     HPI Encounter Diagnoses  Name Primary?   Screening for diabetes mellitus Yes   Morbid obesity (HCC)    History of gastroesophageal reflux (GERD)    Encounter for preoperative examination for general surgical procedure    Vitamin D  deficiency    Vitamin B 12 deficiency    Hypothyroidism due to Hashimoto's thyroiditis    Elevated cholesterol    Depression with anxiety    Statin declined    Calculus of gallbladder without cholecystitis without obstruction    Elevated BP without diagnosis of hypertension    Here for above.  Last seen in January 2024 and asked to return in 6 months.  Has been lost to follow-up since January of last year.  She has been followed by bariatric surgery at [facility] and is here for surgical clearance for gastric sleeve surgery.  Through bariatrics surgery, she has tried multiple medications and been unable to lose weight.  Continues follow-up with endocrinology for hypothyroidism.  Her GYN physician has been following her for depression and anxiety but recently retired.  She has been taking duloxetine  and 120 mg daily for over 10 years and this has worked well for her.  Continues to follow-up with dermatology for eczema treated with Dupixent.  History of cholelithiasis that to date has not been an issue for her.  History of elevated cholesterol.  Has declined treatment.  No history of hypertension.    Review of Systems  Constitutional: Negative.   HENT: Negative.    Eyes:  Negative for blurred vision, discharge and redness.  Respiratory: Negative.    Cardiovascular: Negative.   Gastrointestinal:  Negative for abdominal pain.  Genitourinary: Negative.   Musculoskeletal: Negative.   Negative for myalgias.  Skin:  Negative for rash.  Neurological:  Negative for tingling, loss of consciousness and weakness.  Endo/Heme/Allergies:  Negative for polydipsia.     Current Outpatient Medications:    albuterol  (PROVENTIL  HFA;VENTOLIN  HFA) 108 (90 Base) MCG/ACT inhaler, Inhale 2 puffs into the lungs every 6 (six) hours as needed for shortness of breath., Disp: 1 Inhaler, Rfl: 0   DUPIXENT 300 MG/2ML SOPN, Inject 300 mg into the skin every 14 (fourteen) days., Disp: , Rfl:    famotidine  (PEPCID ) 40 MG tablet, TAKE 1 TABLET BY MOUTH EVERY DAY, Disp: 30 tablet, Rfl: 0   levothyroxine  (SYNTHROID ) 137 MCG tablet, Take 137 mcg by mouth daily before breakfast., Disp: , Rfl:    tiZANidine (ZANAFLEX) 4 MG capsule, Take 4 mg by mouth as needed for muscle spasms., Disp: , Rfl:    Vitamin D , Ergocalciferol , (DRISDOL ) 1.25 MG (50000 UNIT) CAPS capsule, Take 1 capsule (50,000 Units total) by mouth every 7 (seven) days., Disp: 12 capsule, Rfl: 0   DULoxetine  (CYMBALTA ) 60 MG capsule, Take 2 capsules (120 mg total) by mouth daily., Disp: 180 capsule, Rfl: 1   Objective:     BP (!) 142/90 (BP Location: Left Arm, Patient Position: Sitting, Cuff Size: Large)   Pulse 85   Temp 97.6 F (36.4 C) (Temporal)   Ht 5' 4 (1.626 m)   Wt 258 lb 3.2 oz (117.1 kg)   LMP 08/16/2011   SpO2 98%  BMI 44.32 kg/m  BP Readings from Last 3 Encounters:  11/16/23 (!) 142/90  09/25/23 123/81  08/27/23 133/86   Wt Readings from Last 3 Encounters:  11/16/23 258 lb 3.2 oz (117.1 kg)  09/25/23 250 lb (113.4 kg)  08/27/23 247 lb (112 kg)      Physical Exam Constitutional:      General: She is not in acute distress.    Appearance: Normal appearance. She is obese. She is not ill-appearing, toxic-appearing or diaphoretic.  HENT:     Head: Normocephalic and atraumatic.     Right Ear: External ear normal.     Left Ear: External ear normal.     Mouth/Throat:     Mouth: Mucous membranes are moist.      Pharynx: Oropharynx is clear. No oropharyngeal exudate or posterior oropharyngeal erythema.  Eyes:     General: No scleral icterus.       Right eye: No discharge.        Left eye: No discharge.     Extraocular Movements: Extraocular movements intact.     Conjunctiva/sclera: Conjunctivae normal.     Pupils: Pupils are equal, round, and reactive to light.  Cardiovascular:     Rate and Rhythm: Normal rate and regular rhythm.  Pulmonary:     Effort: Pulmonary effort is normal. No respiratory distress.     Breath sounds: Normal breath sounds. No wheezing or rales.  Abdominal:     General: Bowel sounds are normal.  Musculoskeletal:     Cervical back: No rigidity or tenderness.  Lymphadenopathy:     Cervical: No cervical adenopathy.  Skin:    General: Skin is warm and dry.  Neurological:     Mental Status: She is alert and oriented to person, place, and time.  Psychiatric:        Mood and Affect: Mood normal.        Behavior: Behavior normal.      No results found for any visits on 11/16/23.    The ASCVD Risk score (Arnett DK, et al., 2019) failed to calculate for the following reasons:   Risk score cannot be calculated because patient has a medical history suggesting prior/existing ASCVD    Assessment & Plan:   Screening for diabetes mellitus -     Comprehensive metabolic panel with GFR -     Hemoglobin A1c  Morbid obesity (HCC)  History of gastroesophageal reflux (GERD) -     H. pylori breath test -     DG UGI W DOUBLE CM (HD BA); Future  Encounter for preoperative examination for general surgical procedure -     Iron, TIBC and Ferritin Panel -     Vitamin B1 -     Vitamin A  -     Zinc  -     EKG 12-Lead -     DG UGI W DOUBLE CM (HD BA); Future  Vitamin D  deficiency -     VITAMIN D  25 Hydroxy (Vit-D Deficiency, Fractures)  Vitamin B 12 deficiency -     Vitamin B12 -     CBC  Hypothyroidism due to Hashimoto's thyroiditis -     TSH -     T4, free  Elevated  cholesterol -     Lipid panel -     LDL cholesterol, direct  Depression with anxiety -     DULoxetine  HCl; Take 2 capsules (120 mg total) by mouth daily.  Dispense: 180 capsule; Refill: 1  Statin declined  Calculus  of gallbladder without cholecystitis without obstruction  Elevated BP without diagnosis of hypertension    Return in about 3 months (around 02/15/2024), or Check and record blood pressures periodically.SABRA  Spent over 45 minutes interviewing the patient, examining and completing plan for clearance for her bariatric surgery.  Information given on preventing high cholesterol.  She would like a chance to lose weight before starting therapy.  Patient was given on preventing hypertension.  EKG showed regular rate and rhythm with a rate of 75.  There were no ST segment changes.  No evidence for cardiomegaly.   Elsie Sim Lent, MD

## 2023-11-19 ENCOUNTER — Ambulatory Visit: Payer: Self-pay | Admitting: Family Medicine

## 2023-11-21 LAB — IRON,TIBC AND FERRITIN PANEL
%SAT: 25 % (ref 16–45)
Ferritin: 49 ng/mL (ref 16–288)
Iron: 87 ug/dL (ref 45–160)
TIBC: 352 ug/dL (ref 250–450)

## 2023-11-21 LAB — H. PYLORI BREATH TEST: H. pylori Breath Test: NOT DETECTED

## 2023-11-21 LAB — VITAMIN B1: Vitamin B1 (Thiamine): 7 nmol/L — ABNORMAL LOW (ref 8–30)

## 2023-11-21 LAB — ZINC: Zinc: 86 ug/dL (ref 60–130)

## 2023-11-21 LAB — VITAMIN A: Vitamin A (Retinoic Acid): 38 ug/dL (ref 38–98)

## 2023-11-23 ENCOUNTER — Encounter: Payer: Self-pay | Admitting: Nurse Practitioner

## 2023-12-03 ENCOUNTER — Ambulatory Visit: Admitting: Family Medicine

## 2023-12-05 ENCOUNTER — Ambulatory Visit (HOSPITAL_COMMUNITY)
Admission: RE | Admit: 2023-12-05 | Discharge: 2023-12-05 | Disposition: A | Discharge status: Home / Self Care | Source: Ambulatory Visit | Attending: Family Medicine | Admitting: Family Medicine

## 2023-12-05 DIAGNOSIS — Z8719 Personal history of other diseases of the digestive system: Secondary | ICD-10-CM | POA: Diagnosis present

## 2023-12-05 DIAGNOSIS — Z01818 Encounter for other preprocedural examination: Secondary | ICD-10-CM | POA: Diagnosis present

## 2023-12-19 ENCOUNTER — Other Ambulatory Visit: Payer: Self-pay | Admitting: Nurse Practitioner

## 2023-12-19 DIAGNOSIS — E559 Vitamin D deficiency, unspecified: Secondary | ICD-10-CM

## 2023-12-25 ENCOUNTER — Other Ambulatory Visit: Payer: Self-pay | Admitting: Nurse Practitioner

## 2023-12-25 DIAGNOSIS — E88819 Insulin resistance, unspecified: Secondary | ICD-10-CM

## 2024-01-14 ENCOUNTER — Ambulatory Visit: Admitting: Family Medicine

## 2024-02-15 ENCOUNTER — Encounter: Payer: Self-pay | Admitting: Family Medicine

## 2024-02-15 ENCOUNTER — Ambulatory Visit: Admitting: Family Medicine

## 2024-02-15 VITALS — BP 128/70 | HR 83 | Temp 97.9°F | Ht 63.0 in | Wt 224.4 lb

## 2024-02-15 DIAGNOSIS — Z6839 Body mass index (BMI) 39.0-39.9, adult: Secondary | ICD-10-CM | POA: Diagnosis not present

## 2024-02-15 DIAGNOSIS — E519 Thiamine deficiency, unspecified: Secondary | ICD-10-CM

## 2024-02-15 DIAGNOSIS — E782 Mixed hyperlipidemia: Secondary | ICD-10-CM | POA: Diagnosis not present

## 2024-02-15 LAB — LDL CHOLESTEROL, DIRECT: Direct LDL: 124 mg/dL

## 2024-02-15 NOTE — Progress Notes (Signed)
 "  Established Patient Office Visit   Subjective:  Patient ID: Nancy Burch, female    DOB: [birth year]  Age: 63 y.o. MRN: [id number]  No chief complaint on file.   HPI Encounter Diagnoses  Name Primary?   Mixed hyperlipidemia Yes   BMI 39.0-39.9,adult    Thiamine  deficiency     presents with a 34 pound weight loss status post bariatric surgery on the second of this month.  Has moved from severe obesity to class II obesity.  A1c has decreased from 5.9-5.3.  Her blood pressure has normalized.  She continues with high-dose post bariatric surgery vitamins.  She will continue follow-up with the nutritionist through bariatric surgery.  She is exercising by walking and doing some resistance training.  Feels much better.  She is now consuming pured foods.   Review of Systems  Constitutional: Negative.   HENT: Negative.    Eyes:  Negative for blurred vision, discharge and redness.  Respiratory: Negative.    Cardiovascular: Negative.   Gastrointestinal:  Negative for abdominal pain.  Genitourinary: Negative.   Musculoskeletal: Negative.  Negative for myalgias.  Skin:  Negative for rash.  Neurological:  Negative for tingling, loss of consciousness and weakness.  Endo/Heme/Allergies:  Negative for polydipsia.      02/15/2024    8:49 AM 11/16/2023   12:49 PM 03/16/2022    2:40 PM  Depression screen PHQ 2/9  Decreased Interest 0 0 0  Down, Depressed, Hopeless 0 0 0  PHQ - 2 Score 0 0 0  Altered sleeping 0 0   Tired, decreased energy 1 1   Change in appetite 0 0   Feeling bad or failure about yourself  0 0   Trouble concentrating 1 1   Moving slowly or fidgety/restless 0 0   Suicidal thoughts 0 0   PHQ-9 Score 2 2    Difficult doing work/chores Not difficult at all Not difficult at all      Data saved with a previous flowsheet row definition     Current Medications[1]   Objective:     BP 128/70 (BP Location: Right Arm, Patient Position: Sitting, Cuff Size: Large)   Pulse  83   Temp 97.9 F (36.6 C) (Oral)   Ht 5' 3 (1.6 m)   Wt 224 lb 6.4 oz (101.8 kg)   LMP 08/16/2011   SpO2 96%   BMI 39.75 kg/m  BP Readings from Last 3 Encounters:  02/15/24 128/70  11/16/23 (!) 142/90  09/25/23 123/81   Wt Readings from Last 3 Encounters:  02/15/24 224 lb 6.4 oz (101.8 kg)  11/16/23 258 lb 3.2 oz (117.1 kg)  09/25/23 250 lb (113.4 kg)      Physical Exam Constitutional:      General: She is not in acute distress.    Appearance: Normal appearance. She is not ill-appearing, toxic-appearing or diaphoretic.  HENT:     Head: Normocephalic and atraumatic.     Right Ear: External ear normal.     Left Ear: External ear normal.     Mouth/Throat:     Mouth: Mucous membranes are moist.     Pharynx: Oropharynx is clear. No oropharyngeal exudate or posterior oropharyngeal erythema.  Eyes:     General: No scleral icterus.       Right eye: No discharge.        Left eye: No discharge.     Extraocular Movements: Extraocular movements intact.     Conjunctiva/sclera: Conjunctivae normal.  Pulmonary:  Effort: Pulmonary effort is normal. No respiratory distress.  Abdominal:   Skin:    General: Skin is warm and dry.  Neurological:     Mental Status: She is alert and oriented to person, place, and time.  Psychiatric:        Mood and Affect: Mood normal.        Behavior: Behavior normal.      No results found for any visits on 02/15/24.    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  Will continue exercising for 30 minutes daily.   Will continue to follow bariatric guidelines for dietary intake.  Hopes to get down to 150.  Elsie Sim Lent, MD    [1]  Current Outpatient Medications:    albuterol  (PROVENTIL  HFA;VENTOLIN  HFA) 108 (90 Base) MCG/ACT inhaler, Inhale 2 puffs into the lungs every 6 (six) hours as needed for shortness of breath., Disp: 1 Inhaler, Rfl: 0   DULoxetine  (CYMBALTA ) 60 MG capsule, Take 2 capsules (120 mg total) by mouth daily., Disp: 180 capsule, Rfl: 1   DUPIXENT 300 MG/2ML SOPN, Inject 300 mg into the skin every 14 (fourteen) days., Disp: , Rfl:    levothyroxine  (SYNTHROID ) 137 MCG tablet, Take 137 mcg by mouth daily before breakfast., Disp: , Rfl:    omeprazole  (PRILOSEC) 20 MG capsule, Take 40 mg by mouth daily., Disp: , Rfl:    tiZANidine (ZANAFLEX) 4 MG capsule, Take 4 mg by mouth as needed for muscle spasms., Disp: , Rfl:    famotidine  (PEPCID ) 40 MG tablet, TAKE 1 TABLET BY MOUTH EVERY DAY (Patient not taking: Reported on 02/15/2024), Disp: 30 tablet, Rfl: 0  "

## 2024-02-18 ENCOUNTER — Ambulatory Visit: Payer: Self-pay | Admitting: Family Medicine

## 2024-02-19 LAB — VITAMIN B1: Vitamin B1 (Thiamine): <6 nmol/L — ABNORMAL LOW (ref 8–30)

## 2024-02-22 ENCOUNTER — Other Ambulatory Visit: Payer: Self-pay | Admitting: Family Medicine

## 2024-02-22 DIAGNOSIS — E519 Thiamine deficiency, unspecified: Secondary | ICD-10-CM

## 2024-02-22 MED ORDER — THIAMINE HCL 100 MG PO TABS: 100.0000 mg | ORAL_TABLET | Freq: Every day | ORAL | 1 refills | Status: AC

## 2024-08-15 ENCOUNTER — Ambulatory Visit: Admitting: Family Medicine
